# Patient Record
Sex: Female | Born: 1980 | Race: White | Hispanic: No | Marital: Married | Smoking: Never smoker
Health system: Southern US, Community
[De-identification: ages and names within clinical notes are randomized; demographics above are authoritative.]

## PROBLEM LIST (undated history)

## (undated) DIAGNOSIS — J45909 Unspecified asthma, uncomplicated: Secondary | ICD-10-CM

## (undated) DIAGNOSIS — E282 Polycystic ovarian syndrome: Secondary | ICD-10-CM

## (undated) DIAGNOSIS — N840 Polyp of corpus uteri: Secondary | ICD-10-CM

## (undated) DIAGNOSIS — F32A Depression, unspecified: Secondary | ICD-10-CM

## (undated) DIAGNOSIS — Z3183 Encounter for assisted reproductive fertility procedure cycle: Secondary | ICD-10-CM

## (undated) DIAGNOSIS — O24419 Gestational diabetes mellitus in pregnancy, unspecified control: Secondary | ICD-10-CM

## (undated) DIAGNOSIS — F419 Anxiety disorder, unspecified: Secondary | ICD-10-CM

## (undated) DIAGNOSIS — J189 Pneumonia, unspecified organism: Secondary | ICD-10-CM

## (undated) DIAGNOSIS — N970 Female infertility associated with anovulation: Secondary | ICD-10-CM

## (undated) DIAGNOSIS — F909 Attention-deficit hyperactivity disorder, unspecified type: Secondary | ICD-10-CM

## (undated) DIAGNOSIS — Z3141 Encounter for fertility testing: Secondary | ICD-10-CM

## (undated) DIAGNOSIS — F329 Major depressive disorder, single episode, unspecified: Secondary | ICD-10-CM

## (undated) HISTORY — DX: Depression, unspecified: F32.A

## (undated) HISTORY — DX: Polycystic ovarian syndrome: E28.2

## (undated) HISTORY — PX: BREAST REDUCTION SURGERY: SHX8

## (undated) HISTORY — PX: WISDOM TOOTH EXTRACTION: SHX21

## (undated) HISTORY — DX: Major depressive disorder, single episode, unspecified: F32.9

## (undated) HISTORY — DX: Encounter for fertility testing: Z31.41

## (undated) HISTORY — DX: Female infertility associated with anovulation: N97.0

## (undated) HISTORY — DX: Anxiety disorder, unspecified: F41.9

## (undated) HISTORY — DX: Encounter for assisted reproductive fertility procedure cycle: Z31.83

## (undated) HISTORY — PX: INCISIONAL HERNIA REPAIR: SHX193

## (undated) HISTORY — DX: Polyp of corpus uteri: N84.0

## (undated) HISTORY — PX: DILATION AND CURETTAGE OF UTERUS: SHX78

## (undated) HISTORY — PX: OVARIAN CYST REMOVAL: SHX89

## (undated) HISTORY — PX: HERNIA REPAIR: SHX51

---

## 2006-11-18 ENCOUNTER — Emergency Department (HOSPITAL_COMMUNITY): Admission: EM | Admit: 2006-11-18 | Discharge: 2006-11-19 | Payer: Self-pay | Admitting: Emergency Medicine

## 2010-10-16 LAB — CBC
Hemoglobin: 14.5
RBC: 4.73
WBC: 13.3 — ABNORMAL HIGH

## 2010-10-16 LAB — DIFFERENTIAL
Basophils Absolute: 0
Lymphocytes Relative: 4 — ABNORMAL LOW
Lymphs Abs: 0.5 — ABNORMAL LOW
Monocytes Absolute: 0.5
Monocytes Relative: 4
Neutro Abs: 12.2 — ABNORMAL HIGH

## 2010-10-16 LAB — BASIC METABOLIC PANEL
Calcium: 8.7
GFR calc Af Amer: >60
GFR calc non Af Amer: >60
Sodium: 136

## 2010-10-16 LAB — URINALYSIS, ROUTINE W REFLEX MICROSCOPIC
Glucose, UA: NEGATIVE
Ketones, ur: NEGATIVE
Protein, ur: NEGATIVE
pH: 5

## 2010-10-16 LAB — URINE MICROSCOPIC-ADD ON

## 2010-10-16 LAB — PREGNANCY, URINE: Preg Test, Ur: NEGATIVE

## 2013-02-18 ENCOUNTER — Other Ambulatory Visit: Payer: Self-pay | Admitting: Obstetrics and Gynecology

## 2013-02-18 DIAGNOSIS — IMO0002 Reserved for concepts with insufficient information to code with codable children: Secondary | ICD-10-CM

## 2013-02-26 ENCOUNTER — Ambulatory Visit (HOSPITAL_COMMUNITY)
Admission: RE | Admit: 2013-02-26 | Discharge: 2013-02-26 | Disposition: A | Discharge status: Home / Self Care | Payer: BC Managed Care – PPO | Source: Ambulatory Visit | Attending: Obstetrics and Gynecology | Admitting: Obstetrics and Gynecology

## 2013-02-26 DIAGNOSIS — IMO0002 Reserved for concepts with insufficient information to code with codable children: Secondary | ICD-10-CM

## 2013-02-26 DIAGNOSIS — Q51810 Arcuate uterus: Secondary | ICD-10-CM | POA: Insufficient documentation

## 2013-02-26 DIAGNOSIS — N979 Female infertility, unspecified: Secondary | ICD-10-CM | POA: Insufficient documentation

## 2013-02-26 MED ORDER — IOHEXOL 300 MG/ML  SOLN
20.0000 mL | Freq: Once | INTRAMUSCULAR | Status: AC | PRN
  Administered 2013-02-26: 9 mL

## 2013-03-17 ENCOUNTER — Other Ambulatory Visit: Payer: Self-pay | Admitting: Obstetrics and Gynecology

## 2013-03-17 DIAGNOSIS — R1900 Intra-abdominal and pelvic swelling, mass and lump, unspecified site: Secondary | ICD-10-CM

## 2013-03-21 ENCOUNTER — Other Ambulatory Visit: Payer: BC Managed Care – PPO

## 2013-03-21 ENCOUNTER — Ambulatory Visit
Admission: RE | Admit: 2013-03-21 | Discharge: 2013-03-21 | Disposition: A | Discharge status: Home / Self Care | Payer: BC Managed Care – PPO | Source: Ambulatory Visit | Attending: Obstetrics and Gynecology | Admitting: Obstetrics and Gynecology

## 2013-03-21 DIAGNOSIS — R1900 Intra-abdominal and pelvic swelling, mass and lump, unspecified site: Secondary | ICD-10-CM

## 2013-03-21 MED ORDER — GADOBENATE DIMEGLUMINE 529 MG/ML IV SOLN
20.0000 mL | Freq: Once | INTRAVENOUS | Status: AC | PRN
  Administered 2013-03-21: 20 mL via INTRAVENOUS

## 2013-11-01 DIAGNOSIS — Z9079 Acquired absence of other genital organ(s): Secondary | ICD-10-CM | POA: Insufficient documentation

## 2016-05-07 ENCOUNTER — Telehealth: Payer: Self-pay | Admitting: Cardiology

## 2016-05-07 NOTE — Telephone Encounter (Signed)
Received records from [facility] for appointment on 05/09/16 with Dr Antoine Poche.  Records put with Dr Hochrein's schedule for 05/09/16. lp

## 2016-05-08 NOTE — Progress Notes (Signed)
Cardiology Office Note   Date:  05/09/2016   ID:  Emily Sharp, DOB 1980/07/13, MRN [id number]  PCP:  Sid Falcon, MD  Cardiologist:   Rollene Rotunda, MD  Referring:  Dr. April Manson      Chief Complaint  Patient presents with  . Bradycardia      History of Present Illness: Emily Sharp is a 36 y.o. female who is referred by [place] for evaluation of a decreased heart rate that was noticed when she was under anesthesia for.  She's actually had 3 episodes. 2 episodes while she was under anesthesia for minor surgical procedures she says her heart drop into the 30s and she required apparently atrophy. Another episode occurred with conscious sedation event when she was uncomfortable with the procedure set up and had frank syncope with bradycardia. She otherwise does well. She denies any cardiovascular symptoms. She's not otherwise any presyncope or syncope. She's never had any cardiac workup. She walks for exercise about 4 times per week. 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 She takes stairs.  The patient denies any new symptoms such as chest discomfort, neck or arm discomfort. There has been no new shortness of breath, PND or orthopnea. There have been no reported palpitations, presyncope or syncope.     Past Medical History:  Diagnosis Date  . Anxiety and depression   . Encounter for assisted reproductive fertility procedure cycle   . Encounter for fertility testing   . Female infertility associated with anovulation   . PCOS (polycystic ovarian syndrome)   . Polyp of corpus uteri     Past Surgical History:  Procedure Laterality Date  . BREAST REDUCTION SURGERY    . INCISIONAL HERNIA REPAIR    . OVARIAN CYST REMOVAL       Current Outpatient Prescriptions  Medication Sig Dispense Refill  . methylPREDNISolone (MEDROL) 8 MG tablet Take 8 mg by mouth 2 (two) times daily.  0  . progesterone 50 MG/ML injection Inject 1 mL into the muscle  daily.  2  . promethazine (PHENERGAN) 25 MG tablet Take 25 mg by mouth every 8 (eight) hours as needed. for nausea  0  . sertraline (ZOLOFT) 50 MG tablet Take 50 mg by mouth every morning.  3  . SPRINTEC 28 0.25-35 MG-MCG tablet Take 1 tablet by mouth daily.  1   No current facility-administered medications for this visit.     Allergies:   Patient has no known allergies.    Social History:  The patient  reports that she has never smoked. She has never used smokeless tobacco.   Family History:  The patient's family history includes Bladder Cancer in her father; Diabetes in her father; Peripheral vascular disease in her father.    ROS:  Please see the history of present illness.   Otherwise, review of systems are positive for snoring.   All other systems are reviewed and negative.    PHYSICAL EXAM: VS:  BP 104/72   Pulse 84   Ht  (1.727 m)   Wt 255 lb 6.4 oz (115.8 kg)   BMI 38.83 kg/m  , BMI Body mass index is 38.83 kg/m. GENERAL:  Well appearing HEENT:  Pupils equal round and reactive, fundi not visualized, oral mucosa unremarkable NECK:  No jugular venous distention, waveform within normal limits, carotid upstroke brisk and symmetric, no bruits, no thyromegaly LYMPHATICS:  No cervical, inguinal adenopathy LUNGS:  Clear to auscultation bilaterally BACK:  No CVA tenderness CHEST:  Unremarkable HEART:  PMI not displaced or sustained,S1 and S2 within normal limits, no S3, no S4, no clicks, no rubs, no murmurs ABD:  Flat, positive bowel sounds normal in frequency in pitch, no bruits, no rebound, no guarding, no midline pulsatile mass, no hepatomegaly, no splenomegaly EXT:  2 plus pulses throughout, no edema, no cyanosis no clubbing SKIN:  No rashes no nodules NEURO:  Cranial nerves II through XII grossly intact, motor grossly intact throughout PSYCH:  Cognitively intact, oriented to person place and time    EKG:  EKG is ordered today. The ekg ordered today demonstrates  sinus rhythm, rate 84, axis within normal limits, intervals within normal limits, poor anterior R-wave progression, low voltage on the chest leads.   Recent Labs: No results found for requested labs within last 8760 hours.    Lipid Panel No results found for: CHOL, TRIG, HDL, CHOLHDL, VLDL, LDLCALC, LDLDIRECT    Wt Readings from Last 3 Encounters:  05/09/16 255 lb 6.4 oz (115.8 kg)      Other studies Reviewed: Additional studies/ records that were reviewed today include: Office records. Review of the above records demonstrates:  Please see elsewhere in the note.     ASSESSMENT AND PLAN:   BRADYCARDIA:  I will check a 24-hour Holter to evaluate. I suspect that this was a vagal episode. She might have sleep apnea as described below. It's unlikely that she'll need further treatment.  ABNORMAL EKG:  She has low voltage. I'll check an echocardiogram.  SNORING:  She has snoring and bradycardia. She'll need a sleep study.  Current medicines are reviewed at length with the patient today.  The patient does not have concerns regarding medicines.  The following changes have been made:  no change  Labs/ tests ordered today include:   Orders Placed This Encounter  Procedures  . Holter monitor - 24 hour  . ECHOCARDIOGRAM COMPLETE  . Split night study     Disposition:   FU with me as needed after these studies.     Signed, Rollene Rotunda, MD  05/09/2016 9:49 PM    [facility]

## 2016-05-09 ENCOUNTER — Ambulatory Visit (INDEPENDENT_AMBULATORY_CARE_PROVIDER_SITE_OTHER): Payer: BC Managed Care – PPO | Admitting: Cardiology

## 2016-05-09 ENCOUNTER — Encounter: Payer: Self-pay | Admitting: Cardiology

## 2016-05-09 VITALS — BP 104/72 | HR 84 | Ht 68.0 in | Wt 255.4 lb

## 2016-05-09 DIAGNOSIS — R9431 Abnormal electrocardiogram [ECG] [EKG]: Secondary | ICD-10-CM

## 2016-05-09 DIAGNOSIS — R001 Bradycardia, unspecified: Secondary | ICD-10-CM | POA: Diagnosis not present

## 2016-05-09 DIAGNOSIS — G4730 Sleep apnea, unspecified: Secondary | ICD-10-CM | POA: Insufficient documentation

## 2016-05-09 HISTORY — DX: Bradycardia, unspecified: R00.1

## 2016-05-09 NOTE — Patient Instructions (Signed)
Medication Instructions:  Continue current medications  Labwork: None Ordered  Testing/Procedures: Your physician has requested that you have an echocardiogram. Echocardiography is a painless test that uses sound waves to create images of your heart. It provides your doctor with information about the size and shape of your heart and how well your heart's chambers and valves are working. This procedure takes approximately one hour. There are no restrictions for this procedure.  Your physician has recommended that you wear a 24 hour holter monitor. Holter monitors are medical devices that record the heart's electrical activity. Doctors most often use these monitors to diagnose arrhythmias. Arrhythmias are problems with the speed or rhythm of the heartbeat. The monitor is a small, portable device. You can wear one while you do your normal daily activities. This is usually used to diagnose what is causing palpitations/syncope (passing out).  Your physician has recommended that you have a sleep study. This test records several body functions during sleep, including: brain activity, eye movement, oxygen and carbon dioxide blood levels, heart rate and rhythm, breathing rate and rhythm, the flow of air through your mouth and nose, snoring, body muscle movements, and chest and belly movement.  Follow-Up: Your physician recommends that you schedule a follow-up appointment in: After Test   Any Other Special Instructions Will Be Listed Below (If Applicable).     If you need a refill on your cardiac medications before your next appointment, please call your pharmacy.

## 2016-05-13 NOTE — Addendum Note (Signed)
Addended by: Neta EhlersRUITT, Mahsa Hanser M on: 05/13/2016 04:42 PM   Modules accepted: Orders

## 2016-05-23 ENCOUNTER — Other Ambulatory Visit (HOSPITAL_COMMUNITY): Payer: BC Managed Care – PPO

## 2016-06-10 ENCOUNTER — Encounter: Payer: Self-pay | Admitting: *Deleted

## 2016-06-10 NOTE — Progress Notes (Signed)
Patient ID: Emily Sharp, female   DOB: 07/16/1980, 36 y.o.   MRN: [id number]  Patient did not show up for 06/10/16, appointment, to have a 24 hour holter monitor applied.

## 2016-06-17 ENCOUNTER — Other Ambulatory Visit: Payer: Self-pay | Admitting: Family Medicine

## 2016-06-17 ENCOUNTER — Ambulatory Visit
Admission: RE | Admit: 2016-06-17 | Discharge: 2016-06-17 | Disposition: A | Discharge status: Home / Self Care | Payer: BC Managed Care – PPO | Source: Ambulatory Visit | Attending: Family Medicine | Admitting: Family Medicine

## 2016-06-17 DIAGNOSIS — R05 Cough: Secondary | ICD-10-CM

## 2016-06-17 DIAGNOSIS — R059 Cough, unspecified: Secondary | ICD-10-CM

## 2016-06-21 ENCOUNTER — Telehealth: Payer: Self-pay | Admitting: Cardiology

## 2016-06-21 NOTE — Telephone Encounter (Signed)
Called patient yesterday and she said she just found out she was pregnant and was unsure of having the echo.  I called the patient today and left a VM regarding the safety of having the echo while pregnant.  Left my number to call back to schedule.

## 2016-06-27 ENCOUNTER — Encounter (HOSPITAL_BASED_OUTPATIENT_CLINIC_OR_DEPARTMENT_OTHER): Payer: BC Managed Care – PPO

## 2016-06-27 ENCOUNTER — Encounter: Payer: Self-pay | Admitting: Cardiology

## 2016-07-04 ENCOUNTER — Telehealth (HOSPITAL_COMMUNITY): Payer: Self-pay | Admitting: Cardiology

## 2016-07-04 NOTE — Telephone Encounter (Signed)
Called pt and spoke with her and voiced that she just found out that she is pregnant and she voiced that her doctor told her that there is no urgency for this test. She is refusing at this time.

## 2016-07-14 NOTE — Progress Notes (Deleted)
Cardiology Office Note   Date:  07/14/2016   ID:  Emily Sharp, DOB 09/21/1980, MRN [id number]  PCP:  Loyal JacobsonKalish, Michael, MD  Cardiologist:   Rollene RotundaJames Temekia Caskey, MD  Referring:  Dr. April MansonYalcinkaya      No chief complaint on file.     History of Present Illness: Emily Sharp is a 36 y.o. female who is referred by Leonette MostKalish for evaluation of a decreased heart rate that was noticed when she was under anesthesia .  She's actually had 3 episodes. 2 episodes while she was under anesthesia for minor surgical procedures she says her heart drop into the 30s and she required apparently atropine. Another episode occurred with conscious sedation event when she was uncomfortable with the procedure sat  up and had frank syncope with bradycardia. At the last visit she was going to have an echo.  She was hesitant and found out that she is pregnant.  ***   She otherwise does well. She denies any cardiovascular symptoms. She's not otherwise any presyncope or syncope. She's never had any cardiac workup. She walks for exercise about 4 times per week. 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 She takes stairs.  The patient denies any new symptoms such as chest discomfort, neck or arm discomfort. There has been no new shortness of breath, PND or orthopnea. There have been no reported palpitations, presyncope or syncope.     Past Medical History:  Diagnosis Date  . Anxiety and depression   . Encounter for assisted reproductive fertility procedure cycle   . Encounter for fertility testing   . Female infertility associated with anovulation   . PCOS (polycystic ovarian syndrome)   . Polyp of corpus uteri     Past Surgical History:  Procedure Laterality Date  . BREAST REDUCTION SURGERY    . INCISIONAL HERNIA REPAIR    . OVARIAN CYST REMOVAL       Current Outpatient Prescriptions  Medication Sig Dispense Refill  . methylPREDNISolone (MEDROL) 8 MG tablet Take 8 mg by mouth 2 (two)  times daily.  0  . progesterone 50 MG/ML injection Inject 1 mL into the muscle daily.  2  . promethazine (PHENERGAN) 25 MG tablet Take 25 mg by mouth every 8 (eight) hours as needed. for nausea  0  . sertraline (ZOLOFT) 50 MG tablet Take 50 mg by mouth every morning.  3  . SPRINTEC 28 0.25-35 MG-MCG tablet Take 1 tablet by mouth daily.  1   No current facility-administered medications for this visit.     Allergies:   Patient has no known allergies.    ROS:  Please see the history of present illness.   Otherwise, review of systems are positive for ***.   All other systems are reviewed and negative.    PHYSICAL EXAM: VS:  There were no vitals taken for this visit. , BMI There is no height or weight on file to calculate BMI.  GENERAL:  Well appearing NECK:  No jugular venous distention, waveform within normal limits, carotid upstroke brisk and symmetric, no bruits, no thyromegaly LUNGS:  Clear to auscultation bilaterally BACK:  No CVA tenderness CHEST:  Unremarkable HEART:  PMI not displaced or sustained,S1 and S2 within normal limits, no S3, no S4, no clicks, no rubs, *** murmurs ABD:  Flat, positive bowel sounds normal in frequency in pitch, no bruits, no rebound, no guarding, no midline pulsatile mass, no hepatomegaly, no splenomegaly EXT:  2 plus pulses  throughout, no edema, no cyanosis no clubbing   GENERAL:  Well appearing HEENT:  Pupils equal round and reactive, fundi not visualized, oral mucosa unremarkable NECK:  No jugular venous distention, waveform within normal limits, carotid upstroke brisk and symmetric, no bruits, no thyromegaly LYMPHATICS:  No cervical, inguinal adenopathy LUNGS:  Clear to auscultation bilaterally BACK:  No CVA tenderness CHEST:  Unremarkable HEART:  PMI not displaced or sustained,S1 and S2 within normal limits, no S3, no S4, no clicks, no rubs, no murmurs ABD:  Flat, positive bowel sounds normal in frequency in pitch, no bruits, no rebound, no  guarding, no midline pulsatile mass, no hepatomegaly, no splenomegaly EXT:  2 plus pulses throughout, no edema, no cyanosis no clubbing SKIN:  No rashes no nodules NEURO:  Cranial nerves II through XII grossly intact, motor grossly intact throughout PSYCH:  Cognitively intact, oriented to person place and time    EKG:  EKG is *** ordered today. The ekg ordered today demonstrates sinus rhythm, rate ***, axis within normal limits, intervals within normal limits, poor anterior R-wave progression, low voltage on the chest leads.   Recent Labs: No results found for requested labs within last 8760 hours.    Lipid Panel No results found for: CHOL, TRIG, HDL, CHOLHDL, VLDL, LDLCALC, LDLDIRECT    Wt Readings from Last 3 Encounters:  05/09/16 255 lb 6.4 oz (115.8 kg)      Other studies Reviewed: Additional studies/ records that were reviewed today include: *** Review of the above records demonstrates:  ***   ASSESSMENT AND PLAN:   BRADYCARDIA:  ***  I will check a 24-hour Holter to evaluate. I suspect that this was a vagal episode. She might have sleep apnea as described below. It's unlikely that she'll need further treatment.  ABNORMAL EKG:  *** She has low voltage. I'll check an echocardiogram.  SNORING:  ***  She has snoring and bradycardia. She'll need a sleep study.  Current medicines are reviewed at length with the patient today.  The patient does not have concerns regarding medicines.  The following changes have been made:  ***  Labs/ tests ordered today include:   ***  No orders of the defined types were placed in this encounter.    Disposition:   Follow up with me ***   Signed, Rollene Rotunda, MD  07/14/2016 8:57 PM    [facility]

## 2016-07-15 ENCOUNTER — Encounter: Payer: Self-pay | Admitting: *Deleted

## 2016-07-15 ENCOUNTER — Ambulatory Visit: Payer: BC Managed Care – PPO | Admitting: Cardiology

## 2016-12-11 LAB — OB RESULTS CONSOLE ANTIBODY SCREEN: Antibody Screen: NEGATIVE

## 2016-12-11 LAB — OB RESULTS CONSOLE GC/CHLAMYDIA
CHLAMYDIA, DNA PROBE: NEGATIVE
Gonorrhea: NEGATIVE

## 2016-12-11 LAB — OB RESULTS CONSOLE HIV ANTIBODY (ROUTINE TESTING): HIV: NONREACTIVE

## 2016-12-11 LAB — OB RESULTS CONSOLE HEPATITIS B SURFACE ANTIGEN: Hepatitis B Surface Ag: NEGATIVE

## 2016-12-11 LAB — OB RESULTS CONSOLE RUBELLA ANTIBODY, IGM: Rubella: IMMUNE

## 2016-12-11 LAB — OB RESULTS CONSOLE RPR: RPR: NONREACTIVE

## 2016-12-11 LAB — OB RESULTS CONSOLE ABO/RH: RH TYPE: POSITIVE

## 2016-12-18 ENCOUNTER — Other Ambulatory Visit: Payer: Self-pay

## 2016-12-21 ENCOUNTER — Other Ambulatory Visit: Payer: Self-pay

## 2017-03-27 ENCOUNTER — Other Ambulatory Visit (HOSPITAL_COMMUNITY): Payer: Self-pay | Admitting: Obstetrics and Gynecology

## 2017-03-27 DIAGNOSIS — O09522 Supervision of elderly multigravida, second trimester: Secondary | ICD-10-CM

## 2017-03-27 DIAGNOSIS — Z3689 Encounter for other specified antenatal screening: Secondary | ICD-10-CM

## 2017-03-28 ENCOUNTER — Encounter (HOSPITAL_COMMUNITY): Payer: Self-pay | Admitting: Obstetrics and Gynecology

## 2017-04-02 ENCOUNTER — Encounter (HOSPITAL_COMMUNITY): Payer: Self-pay | Admitting: *Deleted

## 2017-04-03 ENCOUNTER — Other Ambulatory Visit: Payer: Self-pay

## 2017-04-03 ENCOUNTER — Other Ambulatory Visit (HOSPITAL_COMMUNITY): Payer: Self-pay | Admitting: Obstetrics and Gynecology

## 2017-04-03 ENCOUNTER — Ambulatory Visit (HOSPITAL_COMMUNITY)
Admission: RE | Admit: 2017-04-03 | Discharge: 2017-04-03 | Disposition: A | Discharge status: Home / Self Care | Payer: BC Managed Care – PPO | Source: Ambulatory Visit | Attending: Obstetrics and Gynecology | Admitting: Obstetrics and Gynecology

## 2017-04-03 ENCOUNTER — Other Ambulatory Visit (HOSPITAL_COMMUNITY): Payer: Self-pay | Admitting: *Deleted

## 2017-04-03 ENCOUNTER — Encounter (HOSPITAL_COMMUNITY): Payer: Self-pay

## 2017-04-03 DIAGNOSIS — O99212 Obesity complicating pregnancy, second trimester: Secondary | ICD-10-CM

## 2017-04-03 DIAGNOSIS — O09812 Supervision of pregnancy resulting from assisted reproductive technology, second trimester: Secondary | ICD-10-CM | POA: Insufficient documentation

## 2017-04-03 DIAGNOSIS — O99512 Diseases of the respiratory system complicating pregnancy, second trimester: Secondary | ICD-10-CM

## 2017-04-03 DIAGNOSIS — O09522 Supervision of elderly multigravida, second trimester: Secondary | ICD-10-CM

## 2017-04-03 DIAGNOSIS — E669 Obesity, unspecified: Secondary | ICD-10-CM | POA: Insufficient documentation

## 2017-04-03 DIAGNOSIS — K469 Unspecified abdominal hernia without obstruction or gangrene: Secondary | ICD-10-CM | POA: Insufficient documentation

## 2017-04-03 DIAGNOSIS — Z3A27 27 weeks gestation of pregnancy: Secondary | ICD-10-CM | POA: Diagnosis not present

## 2017-04-03 DIAGNOSIS — Z3689 Encounter for other specified antenatal screening: Secondary | ICD-10-CM | POA: Diagnosis present

## 2017-04-03 DIAGNOSIS — O26892 Other specified pregnancy related conditions, second trimester: Secondary | ICD-10-CM | POA: Insufficient documentation

## 2017-04-03 DIAGNOSIS — O30042 Twin pregnancy, dichorionic/diamniotic, second trimester: Secondary | ICD-10-CM | POA: Diagnosis not present

## 2017-04-03 DIAGNOSIS — Z362 Encounter for other antenatal screening follow-up: Secondary | ICD-10-CM

## 2017-04-03 DIAGNOSIS — J45909 Unspecified asthma, uncomplicated: Secondary | ICD-10-CM

## 2017-04-23 ENCOUNTER — Encounter: Payer: BC Managed Care – PPO | Attending: Obstetrics and Gynecology | Admitting: Registered"

## 2017-04-23 DIAGNOSIS — Z713 Dietary counseling and surveillance: Secondary | ICD-10-CM | POA: Diagnosis not present

## 2017-04-23 DIAGNOSIS — O99810 Abnormal glucose complicating pregnancy: Secondary | ICD-10-CM

## 2017-04-25 ENCOUNTER — Encounter: Payer: Self-pay | Admitting: Registered"

## 2017-04-25 DIAGNOSIS — O99810 Abnormal glucose complicating pregnancy: Secondary | ICD-10-CM

## 2017-04-25 HISTORY — DX: Abnormal glucose complicating pregnancy: O99.810

## 2017-04-25 NOTE — Progress Notes (Signed)
Patient was seen on 04/23/2017 for Gestational Diabetes self-management class at the [facility]. The following learning objectives were met by the patient during this course:   States the definition of Gestational Diabetes  States why dietary management is important in controlling blood glucose  Describes the effects each nutrient has on blood glucose levels  Demonstrates ability to create a balanced meal plan  Demonstrates carbohydrate counting   States when to check blood glucose levels  Demonstrates proper blood glucose monitoring techniques  States the effect of stress and exercise on blood glucose levels  States the importance of limiting caffeine and abstaining from alcohol and smoking  Blood glucose monitor given: Cont[employer] Next  Lot # [id number] Exp: 06/07/2018 Blood glucose reading: 130  Patient instructed to monitor glucose levels: FBS: 60 - <95; 1 hour: <140; 2 hour: <120  Patient received handouts:  Nutrition Diabetes and Pregnancy, including carb counting list  Patient will be seen for follow-up as needed.

## 2017-05-01 ENCOUNTER — Other Ambulatory Visit (HOSPITAL_COMMUNITY): Payer: Self-pay | Admitting: *Deleted

## 2017-05-01 ENCOUNTER — Ambulatory Visit (HOSPITAL_COMMUNITY)
Admission: RE | Admit: 2017-05-01 | Discharge: 2017-05-01 | Disposition: A | Discharge status: Home / Self Care | Payer: BC Managed Care – PPO | Source: Ambulatory Visit | Attending: Family Medicine | Admitting: Family Medicine

## 2017-05-01 ENCOUNTER — Encounter (HOSPITAL_COMMUNITY): Payer: Self-pay

## 2017-05-01 DIAGNOSIS — Z362 Encounter for other antenatal screening follow-up: Secondary | ICD-10-CM | POA: Diagnosis not present

## 2017-05-01 DIAGNOSIS — O30049 Twin pregnancy, dichorionic/diamniotic, unspecified trimester: Secondary | ICD-10-CM

## 2017-05-01 DIAGNOSIS — Z3A31 31 weeks gestation of pregnancy: Secondary | ICD-10-CM | POA: Insufficient documentation

## 2017-05-06 ENCOUNTER — Ambulatory Visit (INDEPENDENT_AMBULATORY_CARE_PROVIDER_SITE_OTHER): Payer: BC Managed Care – PPO | Admitting: *Deleted

## 2017-05-06 VITALS — BP 121/67 | HR 113

## 2017-05-06 DIAGNOSIS — O30049 Twin pregnancy, dichorionic/diamniotic, unspecified trimester: Secondary | ICD-10-CM

## 2017-05-06 HISTORY — DX: Twin pregnancy, dichorionic/diamniotic, unspecified trimester: O30.049

## 2017-05-06 NOTE — Progress Notes (Signed)
Pt has prenatal appt w/Dr. Elon Spanner today.

## 2017-05-13 ENCOUNTER — Ambulatory Visit: Payer: BC Managed Care – PPO | Admitting: *Deleted

## 2017-05-13 VITALS — BP 121/71 | HR 103

## 2017-05-13 DIAGNOSIS — O30049 Twin pregnancy, dichorionic/diamniotic, unspecified trimester: Secondary | ICD-10-CM

## 2017-05-15 ENCOUNTER — Inpatient Hospital Stay (HOSPITAL_COMMUNITY)
Admission: AD | Admit: 2017-05-15 | Discharge: 2017-05-15 | Disposition: A | Discharge status: Home / Self Care | Payer: BC Managed Care – PPO | Source: Ambulatory Visit | Attending: Obstetrics and Gynecology | Admitting: Obstetrics and Gynecology

## 2017-05-15 ENCOUNTER — Encounter (HOSPITAL_COMMUNITY): Payer: Self-pay | Admitting: *Deleted

## 2017-05-15 DIAGNOSIS — R102 Pelvic and perineal pain: Secondary | ICD-10-CM | POA: Insufficient documentation

## 2017-05-15 DIAGNOSIS — Z3A33 33 weeks gestation of pregnancy: Secondary | ICD-10-CM | POA: Insufficient documentation

## 2017-05-15 DIAGNOSIS — O9989 Other specified diseases and conditions complicating pregnancy, childbirth and the puerperium: Secondary | ICD-10-CM | POA: Diagnosis not present

## 2017-05-15 DIAGNOSIS — E282 Polycystic ovarian syndrome: Secondary | ICD-10-CM | POA: Diagnosis not present

## 2017-05-15 DIAGNOSIS — F329 Major depressive disorder, single episode, unspecified: Secondary | ICD-10-CM | POA: Insufficient documentation

## 2017-05-15 DIAGNOSIS — O99283 Endocrine, nutritional and metabolic diseases complicating pregnancy, third trimester: Secondary | ICD-10-CM | POA: Insufficient documentation

## 2017-05-15 DIAGNOSIS — O26893 Other specified pregnancy related conditions, third trimester: Secondary | ICD-10-CM

## 2017-05-15 DIAGNOSIS — O4703 False labor before 37 completed weeks of gestation, third trimester: Secondary | ICD-10-CM | POA: Diagnosis not present

## 2017-05-15 DIAGNOSIS — N949 Unspecified condition associated with female genital organs and menstrual cycle: Secondary | ICD-10-CM

## 2017-05-15 DIAGNOSIS — O30049 Twin pregnancy, dichorionic/diamniotic, unspecified trimester: Secondary | ICD-10-CM

## 2017-05-15 DIAGNOSIS — F419 Anxiety disorder, unspecified: Secondary | ICD-10-CM | POA: Insufficient documentation

## 2017-05-15 DIAGNOSIS — O30043 Twin pregnancy, dichorionic/diamniotic, third trimester: Secondary | ICD-10-CM | POA: Diagnosis not present

## 2017-05-15 DIAGNOSIS — O479 False labor, unspecified: Secondary | ICD-10-CM

## 2017-05-15 DIAGNOSIS — O99343 Other mental disorders complicating pregnancy, third trimester: Secondary | ICD-10-CM | POA: Insufficient documentation

## 2017-05-15 LAB — URINALYSIS, ROUTINE W REFLEX MICROSCOPIC
Bilirubin Urine: NEGATIVE
GLUCOSE, UA: 50 mg/dL — AB
Ketones, ur: NEGATIVE mg/dL
NITRITE: NEGATIVE
Protein, ur: NEGATIVE mg/dL
SPECIFIC GRAVITY, URINE: 1.019 (ref 1.005–1.030)
pH: 5 (ref 5.0–8.0)

## 2017-05-15 NOTE — MAU Note (Signed)
Pt reports cramping since this am, denies bleeding.

## 2017-05-15 NOTE — MAU Provider Note (Signed)
Chief Complaint:  Contractions   First Provider Initiated Contact with Patient 05/15/17 1731      HPI: Emily Sharp is a 37 y.o. G2P0010 at [redacted]w[redacted]d who presents to maternity admissions sent from the office for contractions. She presented to the office with report of constant pelvic pressure/pain describing it as menstrual like pain.  She reports her cervix was not dilated but she was sent to MAU for further evaluation since she is high risk with twins. There are no other associated symptoms.  She has not tried any treatments. She reports good fetal movement x2.   HPI  Past Medical History: Past Medical History:  Diagnosis Date  . Anxiety and depression   . Encounter for assisted reproductive fertility procedure cycle   . Encounter for fertility testing   . Female infertility associated with anovulation   . PCOS (polycystic ovarian syndrome)   . Polyp of corpus uteri     Past obstetric history: OB History  Gravida Para Term Preterm AB Living  2       1 0  SAB TAB Ectopic Multiple Live Births  1            # Outcome Date GA Lbr Len/2nd Weight Sex Delivery Anes PTL Lv  2 Current           1 SAB             Past Surgical History: Past Surgical History:  Procedure Laterality Date  . BREAST REDUCTION SURGERY    . INCISIONAL HERNIA REPAIR    . OVARIAN CYST REMOVAL    . WISDOM TOOTH EXTRACTION      Family History: Family History  Problem Relation Age of Onset  . Diabetes Father   . Peripheral vascular disease Father   . Bladder Cancer Father     Social History: Social History   Tobacco Use  . Smoking status: Never Smoker  . Smokeless tobacco: Never Used  Substance Use Topics  . Alcohol use: No  . Drug use: Never    Allergies: No Known Allergies  Meds:  No medications prior to admission.    ROS:  Review of Systems  Constitutional: Negative for chills, fatigue and fever.  HENT: Negative for sinus pressure.   Eyes: Negative for photophobia.   Respiratory: Negative for shortness of breath.   Cardiovascular: Negative for chest pain.  Gastrointestinal: Positive for abdominal pain. Negative for constipation, diarrhea, nausea and vomiting.  Genitourinary: Positive for pelvic pain. Negative for difficulty urinating, dysuria, flank pain, frequency, vaginal bleeding, vaginal discharge and vaginal pain.  Musculoskeletal: Negative for neck pain.  Neurological: Negative for dizziness, weakness and headaches.  Psychiatric/Behavioral: Negative.      I have reviewed patient's Past Medical Hx, Surgical Hx, Family Hx, Social Hx, medications and allergies.   Physical Exam   Patient Vitals for the past 24 hrs:  BP Temp Temp src Pulse Resp SpO2 Height Weight  05/15/17 1638 136/78 98.8 F (37.1 C) Oral (!) 101 18 98 %  (1.702 m) (!) 305 lb (138.3 kg)   Constitutional: Well-developed, well-nourished female in no acute distress.  Cardiovascular: normal rate Respiratory: normal effort GI: Abd soft, non-tender, gravid appropriate for gestational age.  MS: Extremities nontender, no edema, normal ROM Neurologic: Alert and oriented x 4.  GU: Neg CVAT.  PELVIC EXAM: Cervix pink, visually closed, without lesion, scant white creamy discharge, vaginal walls and external genitalia normal Bimanual exam: Cervix 0/long/high, firm, anterior, neg CMT, uterus nontender, nonenlarged, adnexa without  tenderness, enlargement, or mass     FHT Baby A: Baseline 130 , moderate variability, accelerations present, no decelerations FHT Baby B:  Baseline 135 , moderate variability, accelerations present, no decelerations Contractions: 2-10 minutes, mild to palpation   Labs: Results for orders placed or performed during the hospital encounter of 05/15/17 (from the past 24 hour(s))  Urinalysis, Routine w reflex microscopic     Status: Abnormal   Collection Time: 05/15/17  4:40 PM  Result Value Ref Range   Color, Urine YELLOW YELLOW   APPearance CLOUDY (A)  CLEAR   Specific Gravity, Urine 1.019 1.005 - 1.030   pH 5.0 5.0 - 8.0   Glucose, UA 50 (A) NEGATIVE mg/dL   Hgb urine dipstick SMALL (A) NEGATIVE   Bilirubin Urine NEGATIVE NEGATIVE   Ketones, ur NEGATIVE NEGATIVE mg/dL   Protein, ur NEGATIVE NEGATIVE mg/dL   Nitrite NEGATIVE NEGATIVE   Leukocytes, UA TRACE (A) NEGATIVE   RBC / HPF 0-5 0 - 5 RBC/hpf   WBC, UA 6-10 0 - 5 WBC/hpf   Bacteria, UA MANY (A) NONE SEEN   Squamous Epithelial / LPF 6-10 0 - 5   Mucus PRESENT       Imaging:  Bedside US performed to assist with placement of fetal monitors, Baby A and Baby B both in breech positions, fluid level subjectively normal x 2, FHR wnl x 2  MAU Course/MDM: Reviewed UA and no evidence of infection, however not a clean catch  NST reviewed and reactive x 2 Pt with improved symptoms, less cramping and pain after 2+ hours in MAU Consult [place] with presentation, exam findings and test results.  D/C home with preterm labor precautions Keep scheduled appt in office   Assessment: 1. Pelvic pressure in pregnancy, antepartum, third trimester   2. Braxton Hicks contractions   3. Dichorionic diamniotic twin pregnancy, antepartum     Plan: Discharge home Labor precautions and fetal kick counts Follow-up Information    Harold Hedge, MD Follow up.   Specialty:  Obstetrics and Gynecology Why:  As scheduled, return to MAU as needed for emergencies Contact information: [address] [place] [address] [phone]          Allergies as of 05/15/2017   No Known Allergies     Medication List    STOP taking these medications   SPRINTEC 28 0.25-35 MG-MCG tablet Generic drug:  norgestimate-ethinyl estradiol     TAKE these medications   ALBUTEROL IN Inhale into the lungs.   FLONASE NA Place into the nose.   FOLIC ACID PO Take by mouth.   methylPREDNISolone 8 MG tablet Commonly known as:  MEDROL Take 8 mg by mouth 2 (two) times daily.   PRENATAL  VITAMIN PO Take by mouth.   progesterone 50 MG/ML injection Inject 1 mL into the muscle daily.   promethazine 25 MG tablet Commonly known as:  PHENERGAN Take 25 mg by mouth every 8 (eight) hours as needed. for nausea   sertraline 50 MG tablet Commonly known as:  ZOLOFT Take 50 mg by mouth every morning.   ZYRTEC PO Take by mouth.       Sharen Counter Certified Nurse-Midwife 05/15/2017 7:34 PM

## 2017-05-20 ENCOUNTER — Ambulatory Visit (INDEPENDENT_AMBULATORY_CARE_PROVIDER_SITE_OTHER): Payer: BC Managed Care – PPO | Admitting: *Deleted

## 2017-05-20 VITALS — BP 122/77 | HR 106

## 2017-05-20 DIAGNOSIS — O30049 Twin pregnancy, dichorionic/diamniotic, unspecified trimester: Secondary | ICD-10-CM

## 2017-05-20 DIAGNOSIS — O30043 Twin pregnancy, dichorionic/diamniotic, third trimester: Secondary | ICD-10-CM | POA: Diagnosis not present

## 2017-05-20 NOTE — Progress Notes (Signed)
Pt has office visit w/Dr. Langston Masker today.

## 2017-05-27 ENCOUNTER — Ambulatory Visit (INDEPENDENT_AMBULATORY_CARE_PROVIDER_SITE_OTHER): Payer: BC Managed Care – PPO | Admitting: *Deleted

## 2017-05-27 VITALS — BP 131/75 | HR 105

## 2017-05-27 DIAGNOSIS — O30043 Twin pregnancy, dichorionic/diamniotic, third trimester: Secondary | ICD-10-CM

## 2017-05-27 DIAGNOSIS — O30049 Twin pregnancy, dichorionic/diamniotic, unspecified trimester: Secondary | ICD-10-CM

## 2017-05-27 NOTE — Progress Notes (Signed)
Pt has office visit with Dr. Henderson Cloud today.

## 2017-05-30 ENCOUNTER — Encounter (HOSPITAL_COMMUNITY): Payer: Self-pay

## 2017-05-30 ENCOUNTER — Ambulatory Visit (HOSPITAL_COMMUNITY): Payer: BC Managed Care – PPO

## 2017-06-05 ENCOUNTER — Ambulatory Visit: Payer: BC Managed Care – PPO | Admitting: *Deleted

## 2017-06-05 ENCOUNTER — Encounter (HOSPITAL_COMMUNITY): Payer: Self-pay

## 2017-06-05 ENCOUNTER — Telehealth (HOSPITAL_COMMUNITY): Payer: Self-pay | Admitting: *Deleted

## 2017-06-05 VITALS — BP 125/80 | HR 98

## 2017-06-05 DIAGNOSIS — O30049 Twin pregnancy, dichorionic/diamniotic, unspecified trimester: Secondary | ICD-10-CM

## 2017-06-05 NOTE — Progress Notes (Signed)
Office visit w/Dr. Vincente Poli today

## 2017-06-05 NOTE — Telephone Encounter (Signed)
Preadmission screen  

## 2017-06-06 ENCOUNTER — Encounter (HOSPITAL_COMMUNITY): Payer: Self-pay

## 2017-06-06 NOTE — Patient Instructions (Signed)
Emily Sharp  06/06/2017   Your procedure is scheduled on:  06/10/2017  [facility] AM[facility]  Pick up the phone at the desk and dial [phone]  Call this number if you have problems the morning of surgery:[phone]  Remember:   Do not eat food:(After Midnight) Desps de medianoche.  Do not drink clear liquids: (After Midnight) Desps de medianoche.  Take these medicines the morning of surgery with A SIP OF WATER: may take zyrtec and flonase   Do not wear jewelry, make-up or nail polish.  Do not wear lotions, powders, or perfumes. Do not wear deodorant.  Do not shave 48 hours prior to surgery.  Do not bring valuables to the hospital.  [facility] is not   responsible for any belongings or valuables brought to the hospital.  Contacts, dentures or bridgework may not be worn into surgery.  Leave suitcase in the car. After surgery it may be brought to your room.  For patients admitted to the hospital, checkout time is 11:00 AM the day of              discharge.    N/A   Please read over the following fact sheets that you were given:   Surgical Site Infection Prevention

## 2017-06-09 ENCOUNTER — Telehealth (HOSPITAL_COMMUNITY): Payer: Self-pay | Admitting: *Deleted

## 2017-06-09 ENCOUNTER — Encounter (HOSPITAL_COMMUNITY)
Admission: RE | Admit: 2017-06-09 | Discharge: 2017-06-09 | Disposition: A | Discharge status: Home / Self Care | Payer: BC Managed Care – PPO | Source: Ambulatory Visit | Attending: Obstetrics and Gynecology | Admitting: Obstetrics and Gynecology

## 2017-06-09 LAB — ABO/RH: ABO/RH(D): O POS

## 2017-06-09 LAB — CBC
HEMATOCRIT: 32.2 % — AB (ref 36.0–46.0)
HEMOGLOBIN: 9.8 g/dL — AB (ref 12.0–15.0)
MCH: 26.3 pg (ref 26.0–34.0)
MCHC: 30.4 g/dL (ref 30.0–36.0)
MCV: 86.6 fL (ref 78.0–100.0)
Platelets: 158 10*3/uL (ref 150–400)
RBC: 3.72 MIL/uL — ABNORMAL LOW (ref 3.87–5.11)
RDW: 15.5 % (ref 11.5–15.5)
WBC: 9.4 10*3/uL (ref 4.0–10.5)

## 2017-06-09 LAB — TYPE AND SCREEN
ABO/RH(D): O POS
ANTIBODY SCREEN: NEGATIVE

## 2017-06-09 NOTE — Anesthesia Preprocedure Evaluation (Addendum)
Anesthesia Evaluation  Patient identified by MRN, date of birth, ID band Patient awake    Reviewed: Allergy & Precautions, NPO status , Patient's Chart, lab work & pertinent test results  Airway Mallampati: III  TM Distance: >3 FB Neck ROM: Full    Dental no notable dental hx. (+) Teeth Intact, Dental Advisory Given   Pulmonary asthma , sleep apnea ,    Pulmonary exam normal breath sounds clear to auscultation       Cardiovascular negative cardio ROS Normal cardiovascular exam Rhythm:Regular Rate:Normal     Neuro/Psych Anxiety negative neurological ROS     GI/Hepatic negative GI ROS, Neg liver ROS,   Endo/Other  diabetesMorbid obesity  Renal/GU negative Renal ROS     Musculoskeletal   Abdominal (+) + obese,   Peds  Hematology negative hematology ROS (+)   Anesthesia Other Findings   Reproductive/Obstetrics (+) Pregnancy                             Lab Results  Component Value Date   WBC 9.4 06/09/2017   HGB 9.8 (L) 06/09/2017   HCT 32.2 (L) 06/09/2017   MCV 86.6 06/09/2017   PLT 158 06/09/2017    Anesthesia Physical Anesthesia Plan  ASA: III  Anesthesia Plan: Spinal   Post-op Pain Management:    Induction:   PONV Risk Score and Plan: Treatment may vary due to age or medical condition  Airway Management Planned: Mask, Natural Airway and Nasal Cannula  Additional Equipment:   Intra-op Plan:   Post-operative Plan:   Informed Consent: I have reviewed the patients History and Physical, chart, labs and discussed the procedure including the risks, benefits and alternatives for the proposed anesthesia with the patient or authorized representative who has indicated his/her understanding and acceptance.     Plan Discussed with: CRNA  Anesthesia Plan Comments:        Anesthesia Quick Evaluation

## 2017-06-09 NOTE — H&P (Signed)
Emily DionesBridget Sharp is a 37 y.o. female presenting for primary cesarean section for Emily Sharp twins. Pregnancy complicated by gestational diabetes - diet controlled. U/S @ 34 3/7=A>breech, 3231 gm (7# 2oz) 98%, B>vertex, 3163gm (7# 0oz) 98%. She has a history of asthma. Also had abdominal hernia repair with mesh. Breech/breech at bedside U/S @ 36 5/7 weeks. BP148/84, 140/82 in office. Labs normal on 06/05/17. OB History    Gravida  2   Para      Term      Preterm      AB  1   Living  0     SAB  1   TAB      Ectopic      Multiple      Live Births             Past Medical History:  Diagnosis Date  . Anxiety and depression   . Asthma   . Encounter for assisted reproductive fertility procedure cycle   . Encounter for fertility testing   . Female infertility associated with anovulation   . Gestational diabetes   . PCOS (polycystic ovarian syndrome)   . Polyp of corpus uteri    Past Surgical History:  Procedure Laterality Date  . BREAST REDUCTION SURGERY    . DILATION AND CURETTAGE OF UTERUS    . INCISIONAL HERNIA REPAIR    . OVARIAN CYST REMOVAL    . WISDOM TOOTH EXTRACTION     Family History: family history includes AAA (abdominal aortic aneurysm) in her father; Bladder Cancer in her father; Diabetes in her father; Hypertension in her father; Peripheral vascular disease in her father; Skin cancer in her father, maternal grandfather, and mother. Social History:  reports that she has never smoked. She has never used smokeless tobacco. She reports that she does not drink alcohol or use drugs.     Maternal Diabetes: Yes:  Diabetes Type:  Diet controlled Genetic Screening: Normal Maternal Ultrasounds/Referrals: Normal Fetal Ultrasounds or other Referrals:  None Maternal Substance Abuse:  No Significant Maternal Medications:  None Significant Maternal Lab Results:  None Other Comments:  None  Review of Systems  Constitutional: Negative for fever.  Eyes:  Negative for blurred vision.  Gastrointestinal: Negative for abdominal pain.  Neurological: Negative for headaches.   History   There were no vitals taken for this visit. Exam Physical Exam  Cardiovascular: Normal rate and regular rhythm.  Respiratory: Effort normal and breath sounds normal.  GI: Soft.    Prenatal labs: ABO, Rh: --/--/O POS, O POS Performed at [facility], [address] [place], [place] [address]  [phone](06/03 1057) Antibody: NEG (06/03 1057) Rubella: Immune (12/05 0000) RPR: Nonreactive (12/05 0000)  HBsAg: Negative (12/05 0000)  HIV: Non-reactive (12/05 0000)  GBS:     Assessment/Plan: 37 yo G2P0 with breech/breech twins (IVF)  PIH For cesarean section   Emily Sharp 06/09/2017, 5:39 PM

## 2017-06-09 NOTE — Telephone Encounter (Signed)
Called Dr Henderson Cloudomblin due to pt reporting a boil on her upper thigh close to her groin that is looking "yucky" ,  Her Cs is tomorrow.  Dr Henderson Cloudtomblin requested pt to come to office for evaluation and possible antibiotics.  Instructed pt to call office after lab draw for work in appointment.  Verbalized understanding.

## 2017-06-10 ENCOUNTER — Inpatient Hospital Stay (HOSPITAL_COMMUNITY): Payer: BC Managed Care – PPO | Admitting: Anesthesiology

## 2017-06-10 ENCOUNTER — Encounter (HOSPITAL_COMMUNITY): Payer: Self-pay | Admitting: Anesthesiology

## 2017-06-10 ENCOUNTER — Encounter (HOSPITAL_COMMUNITY): Payer: Self-pay | Admitting: *Deleted

## 2017-06-10 ENCOUNTER — Inpatient Hospital Stay (HOSPITAL_COMMUNITY)
Admission: AD | Admit: 2017-06-10 | Discharge: 2017-06-13 | DRG: 787 | Disposition: A | Discharge status: Home / Self Care | Payer: BC Managed Care – PPO | Attending: Obstetrics and Gynecology | Admitting: Obstetrics and Gynecology

## 2017-06-10 ENCOUNTER — Encounter (HOSPITAL_COMMUNITY)
Admission: AD | Disposition: A | Discharge status: Home / Self Care | Payer: Self-pay | Source: Home / Self Care | Attending: Obstetrics and Gynecology

## 2017-06-10 DIAGNOSIS — O99214 Obesity complicating childbirth: Secondary | ICD-10-CM | POA: Diagnosis present

## 2017-06-10 DIAGNOSIS — O24419 Gestational diabetes mellitus in pregnancy, unspecified control: Secondary | ICD-10-CM

## 2017-06-10 DIAGNOSIS — Z3A37 37 weeks gestation of pregnancy: Secondary | ICD-10-CM | POA: Diagnosis not present

## 2017-06-10 DIAGNOSIS — J45909 Unspecified asthma, uncomplicated: Secondary | ICD-10-CM | POA: Diagnosis present

## 2017-06-10 DIAGNOSIS — O3090 Multiple gestation, unspecified, unspecified trimester: Secondary | ICD-10-CM

## 2017-06-10 DIAGNOSIS — O9952 Diseases of the respiratory system complicating childbirth: Secondary | ICD-10-CM | POA: Diagnosis present

## 2017-06-10 DIAGNOSIS — O328XX1 Maternal care for other malpresentation of fetus, fetus 1: Secondary | ICD-10-CM | POA: Diagnosis present

## 2017-06-10 DIAGNOSIS — O139 Gestational [pregnancy-induced] hypertension without significant proteinuria, unspecified trimester: Secondary | ICD-10-CM

## 2017-06-10 DIAGNOSIS — L02415 Cutaneous abscess of right lower limb: Secondary | ICD-10-CM | POA: Diagnosis present

## 2017-06-10 DIAGNOSIS — O134 Gestational [pregnancy-induced] hypertension without significant proteinuria, complicating childbirth: Secondary | ICD-10-CM | POA: Diagnosis present

## 2017-06-10 DIAGNOSIS — O9972 Diseases of the skin and subcutaneous tissue complicating childbirth: Secondary | ICD-10-CM | POA: Diagnosis present

## 2017-06-10 DIAGNOSIS — O24420 Gestational diabetes mellitus in childbirth, diet controlled: Secondary | ICD-10-CM | POA: Diagnosis present

## 2017-06-10 DIAGNOSIS — O9902 Anemia complicating childbirth: Secondary | ICD-10-CM | POA: Diagnosis present

## 2017-06-10 DIAGNOSIS — O321XX1 Maternal care for breech presentation, fetus 1: Secondary | ICD-10-CM | POA: Diagnosis present

## 2017-06-10 DIAGNOSIS — D649 Anemia, unspecified: Secondary | ICD-10-CM | POA: Diagnosis present

## 2017-06-10 DIAGNOSIS — Z98891 History of uterine scar from previous surgery: Secondary | ICD-10-CM

## 2017-06-10 DIAGNOSIS — O30043 Twin pregnancy, dichorionic/diamniotic, third trimester: Secondary | ICD-10-CM | POA: Diagnosis present

## 2017-06-10 HISTORY — DX: Gestational diabetes mellitus in pregnancy, unspecified control: O24.419

## 2017-06-10 HISTORY — DX: Gestational (pregnancy-induced) hypertension without significant proteinuria, unspecified trimester: O13.9

## 2017-06-10 HISTORY — DX: Multiple gestation, unspecified, unspecified trimester: O30.90

## 2017-06-10 HISTORY — DX: History of uterine scar from previous surgery: Z98.891

## 2017-06-10 LAB — RPR: RPR Ser Ql: NONREACTIVE

## 2017-06-10 LAB — GLUCOSE, CAPILLARY
GLUCOSE-CAPILLARY: 88 mg/dL (ref 65–99)
GLUCOSE-CAPILLARY: 101 mg/dL — AB (ref 65–99)

## 2017-06-10 SURGERY — Surgical Case
Anesthesia: Spinal

## 2017-06-10 MED ORDER — LACTATED RINGERS IV SOLN
INTRAVENOUS | Status: DC
  Administered 2017-06-10: 125 mL/h via INTRAVENOUS

## 2017-06-10 MED ORDER — OXYTOCIN 10 UNIT/ML IJ SOLN
INTRAMUSCULAR | Status: AC
  Filled 2017-06-10: qty 4

## 2017-06-10 MED ORDER — SODIUM CHLORIDE 0.9 % IV SOLN: INTRAVENOUS | Status: DC

## 2017-06-10 MED ORDER — NALBUPHINE HCL 10 MG/ML IJ SOLN: 5.0000 mg | Freq: Once | INTRAMUSCULAR | Status: DC | PRN

## 2017-06-10 MED ORDER — PRENATAL MULTIVITAMIN CH
1.0000 | ORAL_TABLET | Freq: Every day | ORAL | Status: DC
  Administered 2017-06-11 – 2017-06-13 (×3): 1 via ORAL
  Filled 2017-06-10 (×3): qty 1

## 2017-06-10 MED ORDER — KETOROLAC TROMETHAMINE 30 MG/ML IJ SOLN
INTRAMUSCULAR | Status: AC
  Filled 2017-06-10: qty 1

## 2017-06-10 MED ORDER — FLUTICASONE PROPIONATE 50 MCG/ACT NA SUSP: 1.0000 | Freq: Every day | NASAL | Status: DC | PRN

## 2017-06-10 MED ORDER — DIPHENHYDRAMINE HCL 25 MG PO CAPS: 25.0000 mg | ORAL_CAPSULE | ORAL | Status: DC | PRN

## 2017-06-10 MED ORDER — NALBUPHINE HCL 10 MG/ML IJ SOLN: 5.0000 mg | INTRAMUSCULAR | Status: DC | PRN

## 2017-06-10 MED ORDER — DIPHENHYDRAMINE HCL 50 MG/ML IJ SOLN
INTRAMUSCULAR | Status: DC | PRN
  Administered 2017-06-10: 12.5 mg via INTRAVENOUS

## 2017-06-10 MED ORDER — WITCH HAZEL-GLYCERIN EX PADS: 1.0000 "application " | MEDICATED_PAD | CUTANEOUS | Status: DC | PRN

## 2017-06-10 MED ORDER — SOD CITRATE-CITRIC ACID 500-334 MG/5ML PO SOLN
30.0000 mL | ORAL | Status: AC
  Administered 2017-06-10: 30 mL via ORAL
  Filled 2017-06-10: qty 15

## 2017-06-10 MED ORDER — DEXAMETHASONE SODIUM PHOSPHATE 4 MG/ML IJ SOLN
INTRAMUSCULAR | Status: AC
  Filled 2017-06-10: qty 1

## 2017-06-10 MED ORDER — DIPHENHYDRAMINE HCL 50 MG/ML IJ SOLN
INTRAMUSCULAR | Status: AC
  Filled 2017-06-10: qty 1

## 2017-06-10 MED ORDER — SIMETHICONE 80 MG PO CHEW
80.0000 mg | CHEWABLE_TABLET | ORAL | Status: DC
  Administered 2017-06-11 – 2017-06-12 (×3): 80 mg via ORAL
  Filled 2017-06-10 (×3): qty 1

## 2017-06-10 MED ORDER — SENNOSIDES-DOCUSATE SODIUM 8.6-50 MG PO TABS
2.0000 | ORAL_TABLET | ORAL | Status: DC
  Administered 2017-06-11 – 2017-06-12 (×3): 2 via ORAL
  Filled 2017-06-10 (×3): qty 2

## 2017-06-10 MED ORDER — HYDROCODONE-ACETAMINOPHEN 7.5-325 MG PO TABS
1.0000 | ORAL_TABLET | Freq: Once | ORAL | Status: DC | PRN
Start: 2017-06-10 — End: 2017-06-10

## 2017-06-10 MED ORDER — PHENYLEPHRINE 8 MG IN D5W 100 ML (0.08MG/ML) PREMIX OPTIME
INJECTION | INTRAVENOUS | Status: DC | PRN
  Administered 2017-06-10: 60 ug/min via INTRAVENOUS

## 2017-06-10 MED ORDER — TETANUS-DIPHTH-ACELL PERTUSSIS 5-2.5-18.5 LF-MCG/0.5 IM SUSP: 0.5000 mL | Freq: Once | INTRAMUSCULAR | Status: DC

## 2017-06-10 MED ORDER — PROMETHAZINE HCL 25 MG/ML IJ SOLN: 6.2500 mg | INTRAMUSCULAR | Status: DC | PRN

## 2017-06-10 MED ORDER — IBUPROFEN 600 MG PO TABS
600.0000 mg | ORAL_TABLET | Freq: Four times a day (QID) | ORAL | Status: DC
  Administered 2017-06-10 – 2017-06-13 (×10): 600 mg via ORAL
  Filled 2017-06-10 (×12): qty 1

## 2017-06-10 MED ORDER — OXYTOCIN 40 UNITS IN LACTATED RINGERS INFUSION - SIMPLE MED: 2.5000 [IU]/h | INTRAVENOUS | Status: AC

## 2017-06-10 MED ORDER — MORPHINE SULFATE (PF) 0.5 MG/ML IJ SOLN
INTRAMUSCULAR | Status: AC
  Filled 2017-06-10: qty 10

## 2017-06-10 MED ORDER — KETOROLAC TROMETHAMINE 30 MG/ML IJ SOLN
30.0000 mg | Freq: Four times a day (QID) | INTRAMUSCULAR | Status: AC | PRN
  Administered 2017-06-10: 30 mg via INTRAMUSCULAR

## 2017-06-10 MED ORDER — BUPIVACAINE IN DEXTROSE 0.75-8.25 % IT SOLN
INTRATHECAL | Status: DC | PRN
  Administered 2017-06-10: 1 mL via INTRATHECAL

## 2017-06-10 MED ORDER — MEPERIDINE HCL 25 MG/ML IJ SOLN: 6.2500 mg | INTRAMUSCULAR | Status: DC | PRN

## 2017-06-10 MED ORDER — DIPHENHYDRAMINE HCL 25 MG PO CAPS: 25.0000 mg | ORAL_CAPSULE | Freq: Four times a day (QID) | ORAL | Status: DC | PRN

## 2017-06-10 MED ORDER — SIMETHICONE 80 MG PO CHEW: 80.0000 mg | CHEWABLE_TABLET | ORAL | Status: DC | PRN

## 2017-06-10 MED ORDER — LACTATED RINGERS IV SOLN
INTRAVENOUS | Status: DC | PRN
  Administered 2017-06-10 (×2): via INTRAVENOUS

## 2017-06-10 MED ORDER — ALBUTEROL SULFATE (2.5 MG/3ML) 0.083% IN NEBU: 3.0000 mL | INHALATION_SOLUTION | RESPIRATORY_TRACT | Status: DC | PRN

## 2017-06-10 MED ORDER — ZOLPIDEM TARTRATE 5 MG PO TABS: 5.0000 mg | ORAL_TABLET | Freq: Every evening | ORAL | Status: DC | PRN

## 2017-06-10 MED ORDER — OXYCODONE HCL 5 MG PO TABS
10.0000 mg | ORAL_TABLET | ORAL | Status: DC | PRN
  Administered 2017-06-11 – 2017-06-13 (×4): 10 mg via ORAL
  Filled 2017-06-10 (×4): qty 2

## 2017-06-10 MED ORDER — EPHEDRINE 5 MG/ML INJ
INTRAVENOUS | Status: AC
  Filled 2017-06-10: qty 10

## 2017-06-10 MED ORDER — ACETAMINOPHEN 325 MG PO TABS
650.0000 mg | ORAL_TABLET | ORAL | Status: DC | PRN
  Administered 2017-06-11 – 2017-06-13 (×6): 650 mg via ORAL
  Filled 2017-06-10 (×6): qty 2

## 2017-06-10 MED ORDER — HYDROMORPHONE HCL 1 MG/ML IJ SOLN: 0.2500 mg | INTRAMUSCULAR | Status: DC | PRN

## 2017-06-10 MED ORDER — LACTATED RINGERS IV SOLN
INTRAVENOUS | Status: DC | PRN
  Administered 2017-06-10: 40 [IU] via INTRAVENOUS

## 2017-06-10 MED ORDER — ONDANSETRON HCL 4 MG/2ML IJ SOLN
INTRAMUSCULAR | Status: DC | PRN
  Administered 2017-06-10: 4 mg via INTRAVENOUS

## 2017-06-10 MED ORDER — LACTATED RINGERS IV SOLN
INTRAVENOUS | Status: DC | PRN
  Administered 2017-06-10: 11:00:00 via INTRAVENOUS

## 2017-06-10 MED ORDER — PHENYLEPHRINE 8 MG IN D5W 100 ML (0.08MG/ML) PREMIX OPTIME
INJECTION | INTRAVENOUS | Status: AC
  Filled 2017-06-10: qty 100

## 2017-06-10 MED ORDER — DIPHENHYDRAMINE HCL 50 MG/ML IJ SOLN: 12.5000 mg | INTRAMUSCULAR | Status: DC | PRN

## 2017-06-10 MED ORDER — KETOROLAC TROMETHAMINE 30 MG/ML IJ SOLN: 30.0000 mg | Freq: Four times a day (QID) | INTRAMUSCULAR | Status: AC | PRN

## 2017-06-10 MED ORDER — DEXTROSE 5 % IV SOLN
3.0000 g | INTRAVENOUS | Status: AC
  Administered 2017-06-10: 3 g via INTRAVENOUS
  Filled 2017-06-10: qty 3000

## 2017-06-10 MED ORDER — ACETAMINOPHEN 10 MG/ML IV SOLN: 1000.0000 mg | Freq: Once | INTRAVENOUS | Status: DC | PRN

## 2017-06-10 MED ORDER — SIMETHICONE 80 MG PO CHEW
80.0000 mg | CHEWABLE_TABLET | Freq: Three times a day (TID) | ORAL | Status: DC
  Administered 2017-06-10 – 2017-06-13 (×7): 80 mg via ORAL
  Filled 2017-06-10 (×7): qty 1

## 2017-06-10 MED ORDER — COCONUT OIL OIL: 1.0000 "application " | TOPICAL_OIL | Status: DC | PRN

## 2017-06-10 MED ORDER — DEXAMETHASONE SODIUM PHOSPHATE 4 MG/ML IJ SOLN
INTRAMUSCULAR | Status: DC | PRN
  Administered 2017-06-10: 4 mg via INTRAVENOUS

## 2017-06-10 MED ORDER — PHENYLEPHRINE 40 MCG/ML (10ML) SYRINGE FOR IV PUSH (FOR BLOOD PRESSURE SUPPORT)
PREFILLED_SYRINGE | INTRAVENOUS | Status: AC
  Filled 2017-06-10: qty 10

## 2017-06-10 MED ORDER — NALOXONE HCL 4 MG/10ML IJ SOLN
1.0000 ug/kg/h | INTRAVENOUS | Status: DC | PRN
  Filled 2017-06-10: qty 5

## 2017-06-10 MED ORDER — OXYCODONE HCL 5 MG PO TABS
5.0000 mg | ORAL_TABLET | ORAL | Status: DC | PRN
  Administered 2017-06-11 – 2017-06-12 (×5): 5 mg via ORAL
  Filled 2017-06-10 (×5): qty 1

## 2017-06-10 MED ORDER — MENTHOL 3 MG MT LOZG: 1.0000 | LOZENGE | OROMUCOSAL | Status: DC | PRN

## 2017-06-10 MED ORDER — EPHEDRINE SULFATE 50 MG/ML IJ SOLN
INTRAMUSCULAR | Status: DC | PRN
  Administered 2017-06-10: 10 mg via INTRAVENOUS

## 2017-06-10 MED ORDER — DIBUCAINE 1 % RE OINT: 1.0000 "application " | TOPICAL_OINTMENT | RECTAL | Status: DC | PRN

## 2017-06-10 MED ORDER — ONDANSETRON HCL 4 MG/2ML IJ SOLN
INTRAMUSCULAR | Status: AC
  Filled 2017-06-10: qty 2

## 2017-06-10 MED ORDER — MORPHINE SULFATE (PF) 0.5 MG/ML IJ SOLN
INTRAMUSCULAR | Status: DC | PRN
  Administered 2017-06-10: .2 mg via INTRATHECAL

## 2017-06-10 MED ORDER — SCOPOLAMINE 1 MG/3DAYS TD PT72
1.0000 | MEDICATED_PATCH | Freq: Once | TRANSDERMAL | Status: AC
  Administered 2017-06-10: 1.5 mg via TRANSDERMAL
  Filled 2017-06-10: qty 1

## 2017-06-10 SURGICAL SUPPLY — 40 items
BENZOIN TINCTURE PRP APPL 2/3 (GAUZE/BANDAGES/DRESSINGS) IMPLANT
CHLORAPREP W/TINT 26ML (MISCELLANEOUS) ×3 IMPLANT
CLAMP CORD UMBIL (MISCELLANEOUS) ×3 IMPLANT
CLOSURE WOUND 1/2 X4 (GAUZE/BANDAGES/DRESSINGS)
CLOTH BEACON ORANGE TIMEOUT ST (SAFETY) ×3 IMPLANT
DERMABOND ADVANCED (GAUZE/BANDAGES/DRESSINGS)
DERMABOND ADVANCED .7 DNX12 (GAUZE/BANDAGES/DRESSINGS) IMPLANT
DRESSING DISP NPWT PICO 4X12 (MISCELLANEOUS) ×3 IMPLANT
DRSG OPSITE POSTOP 4X10 (GAUZE/BANDAGES/DRESSINGS) ×3 IMPLANT
ELECT REM PT RETURN 9FT ADLT (ELECTROSURGICAL) ×3
ELECTRODE REM PT RTRN 9FT ADLT (ELECTROSURGICAL) ×1 IMPLANT
EXTRACTOR VACUUM M CUP 4 TUBE (SUCTIONS) IMPLANT
EXTRACTOR VACUUM M CUP 4' TUBE (SUCTIONS)
GLOVE BIO SURGEON STRL SZ7.5 (GLOVE) ×3 IMPLANT
GLOVE BIOGEL PI IND STRL 7.0 (GLOVE) ×2 IMPLANT
GLOVE BIOGEL PI INDICATOR 7.0 (GLOVE) ×4
GOWN STRL REUS W/TWL LRG LVL3 (GOWN DISPOSABLE) ×6 IMPLANT
KIT ABG SYR 3ML LUER SLIP (SYRINGE) ×6 IMPLANT
NDL SAFETY ECLIPSE 18X1.5 (NEEDLE) ×1 IMPLANT
NEEDLE HYPO 18GX1.5 SHARP (NEEDLE) ×2
NEEDLE HYPO 21X1.5 SAFETY (NEEDLE) ×3 IMPLANT
NEEDLE HYPO 25X5/8 SAFETYGLIDE (NEEDLE) ×6 IMPLANT
NS IRRIG 1000ML POUR BTL (IV SOLUTION) ×3 IMPLANT
PACK C SECTION WH (CUSTOM PROCEDURE TRAY) ×3 IMPLANT
PAD OB MATERNITY 4.3X12.25 (PERSONAL CARE ITEMS) ×3 IMPLANT
PENCIL SMOKE EVAC W/HOLSTER (ELECTROSURGICAL) ×3 IMPLANT
STAPLER VISISTAT 35W (STAPLE) ×3 IMPLANT
STRIP CLOSURE SKIN 1/2X4 (GAUZE/BANDAGES/DRESSINGS) IMPLANT
SUT MNCRL 0 VIOLET CTX 36 (SUTURE) ×4 IMPLANT
SUT MONOCRYL 0 CTX 36 (SUTURE) ×8
SUT PDS AB 0 CTX 60 (SUTURE) ×3 IMPLANT
SUT PLAIN 0 NONE (SUTURE) IMPLANT
SUT PLAIN 2 0 (SUTURE)
SUT PLAIN 2 0 XLH (SUTURE) ×3 IMPLANT
SUT PLAIN ABS 2-0 CT1 27XMFL (SUTURE) IMPLANT
SUT VIC AB 4-0 KS 27 (SUTURE) ×3 IMPLANT
SYR BULB 3OZ (MISCELLANEOUS) ×3 IMPLANT
SYRINGE 10CC LL (SYRINGE) ×3 IMPLANT
TOWEL OR 17X24 6PK STRL BLUE (TOWEL DISPOSABLE) ×3 IMPLANT
TRAY FOLEY W/BAG SLVR 14FR LF (SET/KITS/TRAYS/PACK) ×3 IMPLANT

## 2017-06-10 NOTE — Anesthesia Postprocedure Evaluation (Signed)
Anesthesia Post Note  Patient: Emily Sharp  Procedure(s) Performed: PRIMARY CESAREAN SECTION MULTI-GESTATIONAL (N/A )     Patient location during evaluation: Mother Baby Anesthesia Type: Spinal Level of consciousness: awake and alert Pain management: pain level controlled Vital Signs Assessment: post-procedure vital signs reviewed and stable Respiratory status: spontaneous breathing, nonlabored ventilation and respiratory function stable Cardiovascular status: stable Postop Assessment: no headache, no backache, no apparent nausea or vomiting, patient able to bend at knees, able to ambulate, spinal receding and adequate PO intake Anesthetic complications: no    Last Vitals:  Vitals:   06/10/17 1430 06/10/17 1615  BP: (!) 135/94 (!) 142/79  Pulse: 80 80  Resp: 16 16  Temp: 36.8 C 36.9 C  SpO2: 99% 99%    Last Pain:  Vitals:   06/10/17 1615  TempSrc: Oral  PainSc:    Pain Goal:                 Land O'LakesMalinova,Dalaney Needle Hristova

## 2017-06-10 NOTE — Op Note (Signed)
NAME: Emily Sharp, Emily Sharp MEDICAL RECORD [id number] ACCOUNT [id number] DATE OF BIRTH:07/30/1980 FACILITY: [facility] LOCATION: [facility] PHYSICIAN:Grayson White Jamal Collin II, MD  OPERATIVE REPORT  DATE OF PROCEDURE:  06/10/2017  PREOPERATIVE DIAGNOSES: 1.  Twin gestation, breech vertex. 2.  Pregnancy-induced hypertension. 3.  Gestational diabetes.  POSTOPERATIVE DIAGNOSES: 1.  Twin gestation, breech vertex. 2.  Pregnancy-induced hypertension. 3.  Gestational diabetes.  PROCEDURE:  Primary low-transverse cesarean section.  SURGEON:  Harold Hedge II, MD  ASSISTANT:  Janalyn Shy, FNA  ANESTHESIA:  Spinal.  ESTIMATED BLOOD LOSS:  See anesthesia note.  SPECIMENS:  Placentas to pathology.  FINDINGS:  Baby A viable female infant.  Apgars and arterial cord pH and birth weight pending.  Baby B viable female infant.  Apgars and arterial cord pH and birth weight pending.  INDICATIONS AND CONSENT:  This patient is a 37 year old G2 P0 at 37-3/7 weeks with an IVF twin gestation.  Babies are noted to be breech vertex, on a bedside ultrasound, breech breech.  She has had gestational diabetes with diet control.  Blood pressures  have become more labile with 140s over upper 80s and 90s noted.  Cesarean section has been reviewed preoperatively.  The patient also had a previous abdominal midline incision with mesh placement for hernia repair.  Potential risks and complications  were discussed with the patient preoperatively, including but not limited to infection, organ damage, bleeding requiring transfusion of blood products with HIV and hepatitis acquisition, DVT, PE, pneumonia, return to the operating room.  The patient  stated she understood and agreed, and consent was signed and in the chart.  DESCRIPTION OF PROCEDURE:  The patient was taken to the operating room where she was identified.  Spinal anesthetic was placed per anesthesiology, and she was placed in the dorsal supine position with a  15-degree left lateral wedge.  She was prepped  vaginally, and a Foley catheter was placed with a Betadine prep.  She also has a small superficial skin abscess on the upper right thigh, which was covered with a dressing.  A Traxi was used to elevate the panniculus, and she was prepped abdominally with  ChloraPrep.  After 3-minute drying time, she was draped in a sterile fashion.  Timeout was undertaken.  She was tested for adequate spinal anesthesia, and skin was entered through a Pfannenstiel incision.  Dissection was carried out in layers to the  peritoneum which was incised and extended superiorly and inferiorly.  The mesh was not encountered.  The vesicouterine peritoneum was taken down cephalad laterally.  Bladder flap developed.  The bladder blade was placed.  The uterus was incised in a low  transverse manner.  The uterine cavity was entered bluntly with a hemostat.  The uterine incision was extended with the fingers.  Clear fluid was noted.  Baby was delivered from the double footling breech position without difficulty.  Good cry and tone  were noted after 1-minute waiting time.  The cord was clamped and cut, and the baby was handed to waiting pediatrics team.  Baby B was vertex.  Artificial rupture of membranes for clear fluid was done.  The vertex and the baby were then delivered without  difficulty.  There was a loose nuchal cord x1 around baby B.  Good cry and tone were noted.  After a 1-minute waiting time, the cord was clamped and cut, and the baby was handed to waiting pediatrics team.  After drawing the blood from both cords, baby  A's cord was marked with a plastic  umbilical cord clamp.  Placentas were then delivered.  Sent to pathology.  Uterine cavity was thoroughly explored and was clean.  The uterus was closed in 2 running locking imbricating layers of 0 Monocryl suture, which  achieved good hemostasis.  The left ovary appeared normal, and the right ovary and right fallopian tube  appeared normal.  Lavage was carried out.  Anterior peritoneum was closed in a running fashion with a 0 Monocryl suture, which was also used to  reapproximate the pyramidalis muscle in the midline.  Anterior rectus fascia was closed in a running fashion with a 0 looped PDS.  Subcutaneous layer was closed with interrupted plain and the skin was closed with clips.  A PICO dressing was applied.  All  counts were correct.  The patient was transferred to the recovery room in stable condition.  LN/NUANCE  D:06/10/2017 T:06/10/2017 JOB:[id number]

## 2017-06-10 NOTE — Addendum Note (Signed)
Addendum  created 06/10/17 1712 by Elgie CongoMalinova, Aubriee Szeto H, CRNA   Sign clinical note

## 2017-06-10 NOTE — Anesthesia Procedure Notes (Signed)
Spinal  Patient location during procedure: OB Start time: 06/10/2017 9:59 AM End time: 06/10/2017 10:05 AM Staffing Anesthesiologist: Trevor IhaHouser, Stephen A, MD Performed: anesthesiologist  Preanesthetic Checklist Completed: patient identified, surgical consent, pre-op evaluation, timeout performed, IV checked, risks and benefits discussed and monitors and equipment checked Spinal Block Patient position: sitting Prep: site prepped and draped and DuraPrep Patient monitoring: heart rate, cardiac monitor, continuous pulse ox and blood pressure Approach: midline Location: L3-4 Injection technique: single-shot Needle Needle type: Pencan  Needle gauge: 24 G Needle length: 10 cm Assessment Sensory level: T4

## 2017-06-10 NOTE — Lactation Note (Signed)
This note was copied from a baby's chart. Lactation Consultation Note  Patient Name: Emily Sharp Emily Sharp ZOXWR'UToday's Date: 06/10/2017 Reason for consult: Initial assessment;Multiple gestation;Early term 37-38.6wks;Breast reduction;1st time breastfeeding;Primapara;Maternal endocrine disorder Type of Endocrine Disorder?: PCOS  Baby A Not seen by lactation yet, he's in NICU  Baby B 12 hours old early term twin female who is being exclusively BF by her mother, she's a P1. Mom has a history of PCOS and a breast reduction at 37 y.o. RN was already putting baby to the breast when entering the room, LC took over latch assistance. RN fit mom up with a NS # 24 due to difficult latch. When baby is at the breast he'll suck vigorously, but no swallows were heard, baby was dimpling had to reposition. Once baby was repositioned, dimpling disappeared but baby still getting nipple (with NS) in and out of his mouth at every suck due to non-compressible tissue, nipples invert upon suction or compression.  Mom Mom has flat nipples with non-compressible tissue. Both nipples looked intact upon examination with no signs of trauma. Per mom feedings at the breast are comfortable, when baby was done feeding off the left breast only saliva was noticed on the NS. Made parents aware of non nutritive sucking and instructed the to limit feedings at the breast to no more then 30 minutes according to late preterm protocol.  Mom already has breast shells and she's double pumping, she pumped twice today but no colostrum seen with pumping, LC also tried to hand express and no colostrum seen either. Mom will start wearing shells tomorrow, instructions, cleaning and storage were reviewed.  Encouraged mom to keep feeding baby 8-12 times/24 hours or sooner if feeding cues are present. If baby is not cueing in a 3 hour period mom will put him STS to the breast to give him an opportunity to feed. Mom will pump every 3 hours after feedings If  baby is still showing hunger cues or reaches the 24 hour mark parents are aware that supplementation may be necessary and mother's milk will be our first choice. If no mother's milk is available, baby will be supplemented with Similac 19 calorie formula, like twin brother A. BF brochure, BF resources and feeding diary were reviewed, both parents are aware of LC services and will call PRN.  Maternal Data Formula Feeding for Exclusion: Yes Reason for exclusion: Previous breast surgery (mastectomy, reduction, or augmentation where mother is unable to produce breast milk) Has patient been taught Hand Expression?: Yes Does the patient have breastfeeding experience prior to this delivery?: No  Feeding Feeding Type: Breast Fed Length of feed: 2 min  LATCH Score Latch: Too sleepy or reluctant, no latch achieved, no sucking elicited.  Audible Swallowing: A few with stimulation  Type of Nipple: Flat  Comfort (Breast/Nipple): Soft / non-tender  Hold (Positioning): Full assist, staff holds infant at breast  LATCH Score: 4  Interventions Interventions: Breast feeding basics reviewed;Assisted with latch(Nipple Shield given by Alba CoryMichelle H. RN)  Lactation Tools Discussed/Used Tools: Pump;Nipple Dorris CarnesShields;Shells Nipple shield size: 24 Shell Type: Inverted Breast pump type: Double-Electric Breast Pump WIC Program: No Pump Review: Setup, frequency, and cleaning Initiated by:: RN Date initiated:: 06/10/17   Consult Status Consult Status: Follow-up Date: 06/11/17 Follow-up type: In-patient    Nikolina Simerson Venetia ConstableS Eulla Kochanowski 06/10/2017, 10:56 PM

## 2017-06-10 NOTE — Anesthesia Postprocedure Evaluation (Signed)
Anesthesia Post Note  Patient: Emily Sharp  Procedure(s) Performed: PRIMARY CESAREAN SECTION MULTI-GESTATIONAL (N/A )     Patient location during evaluation: PACU Anesthesia Type: Spinal Level of consciousness: oriented and awake and alert Pain management: pain level controlled Vital Signs Assessment: post-procedure vital signs reviewed and stable Respiratory status: spontaneous breathing, respiratory function stable and patient connected to nasal cannula oxygen Cardiovascular status: blood pressure returned to baseline and stable Postop Assessment: no headache, no backache and no apparent nausea or vomiting Anesthetic complications: no    Last Vitals:  Vitals:   06/10/17 1231 06/10/17 1330  BP: 129/71 134/79  Pulse: 64 61  Resp: 18 18  Temp: 36.7 C 36.9 C  SpO2: 97% 99%    Last Pain:  Vitals:   06/10/17 1330  TempSrc: Oral  PainSc: 3    Pain Goal:                 Trevor IhaStephen A Cipriano Millikan

## 2017-06-10 NOTE — Progress Notes (Signed)
1200 PACU CBG 101

## 2017-06-10 NOTE — Brief Op Note (Signed)
06/10/2017  10:58 AM  PATIENT:  Emily Sharp  37 y.o. female  PRE-OPERATIVE DIAGNOSIS:  primary,twins, history of hernia/mesh surgery  POST-OPERATIVE DIAGNOSIS:  primary,twins, history of hernia/mesh surgery  PROCEDURE:  Procedure(s) with comments: PRIMARY CESAREAN SECTION MULTI-GESTATIONAL (N/A) - [name], RNFA  SURGEON:  Surgeon(s) and Role:    * Harold Hedgeomblin, Tremain Rucinski, MD - Primary  PHYSICIAN ASSISTANT:   ASSISTANTS: Theatre managerHeather Kreitmeyer   ANESTHESIA:   spinal  EBL:  400 mL   BLOOD ADMINISTERED:none  DRAINS: Urinary Catheter (Foley)   LOCAL MEDICATIONS USED:  NONE  SPECIMEN:  Source of Specimen:  placentas  DISPOSITION OF SPECIMEN:  PATHOLOGY  COUNTS:  YES  TOURNIQUET:  * No tourniquets in log *  DICTATION: .Other Dictation: Dictation Number [id number]  PLAN OF CARE: Admit to inpatient   PATIENT DISPOSITION:  PACU - hemodynamically stable.   Delay start of Pharmacological VTE agent (>24hrs) due to surgical blood loss or risk of bleeding: not applicable

## 2017-06-10 NOTE — Progress Notes (Signed)
No changes to H&P per patient history No HA, no vision change, no epigastric pain Reviewed with patient procedure and risks-cesarean section All questions answered Patient states she understands and agrees

## 2017-06-10 NOTE — Transfer of Care (Signed)
Immediate Anesthesia Transfer of Care Note  Patient: Emily Sharp  Procedure(s) Performed: PRIMARY CESAREAN SECTION MULTI-GESTATIONAL (N/A )  Patient Location: PACU  Anesthesia Type:Spinal  Level of Consciousness: awake, alert  and oriented  Airway & Oxygen Therapy: Patient Spontanous Breathing  Post-op Assessment: Report given to RN and Post -op Vital signs reviewed and stable  Post vital signs: Reviewed and stable  Last Vitals:  Vitals Value Taken Time  BP 134/75 06/10/2017 11:16 AM  Temp    Pulse 85 06/10/2017 11:18 AM  Resp 7 06/10/2017 11:18 AM  SpO2 97 % 06/10/2017 11:18 AM  Vitals shown include unvalidated device data.  Last Pain:  Vitals:   06/10/17 0802  TempSrc: Oral         Complications: No apparent anesthesia complications

## 2017-06-11 ENCOUNTER — Other Ambulatory Visit: Payer: BC Managed Care – PPO

## 2017-06-11 LAB — CBC
HEMATOCRIT: 25.4 % — AB (ref 36.0–46.0)
Hemoglobin: 7.9 g/dL — ABNORMAL LOW (ref 12.0–15.0)
MCH: 26.4 pg (ref 26.0–34.0)
MCHC: 31.1 g/dL (ref 30.0–36.0)
MCV: 84.9 fL (ref 78.0–100.0)
PLATELETS: 112 10*3/uL — AB (ref 150–400)
RBC: 2.99 MIL/uL — ABNORMAL LOW (ref 3.87–5.11)
RDW: 15.6 % — AB (ref 11.5–15.5)
WBC: 8.8 10*3/uL (ref 4.0–10.5)

## 2017-06-11 LAB — GLUCOSE, CAPILLARY
GLUCOSE-CAPILLARY: 102 mg/dL — AB (ref 65–99)
Glucose-Capillary: 131 mg/dL — ABNORMAL HIGH (ref 65–99)

## 2017-06-11 MED ORDER — FERROUS SULFATE 325 (65 FE) MG PO TABS
325.0000 mg | ORAL_TABLET | Freq: Two times a day (BID) | ORAL | Status: DC
  Administered 2017-06-11 – 2017-06-13 (×5): 325 mg via ORAL
  Filled 2017-06-11 (×5): qty 1

## 2017-06-11 NOTE — Progress Notes (Signed)
Subjective: Postpartum Day 1: Cesarean Delivery Patient reports tolerating PO and no problems voiding.  One baby in NICU for respiratory support.  Desires circ.  No HA, CP/SOB, RUQ pain, or vision change.    Objective: Vital signs in last 24 hours: Temp:  [97.7 F (36.5 C)-98.7 F (37.1 C)] 98.7 F (37.1 C) (06/05 0622) Pulse Rate:  [61-85] 80 (06/05 0622) Resp:  [14-23] 16 (06/05 0622) BP: (111-142)/(61-94) 126/76 (06/05 0622) SpO2:  [95 %-100 %] 98 % (06/05 0622)  Physical Exam:  General: alert, cooperative and appears stated age 29Lochia: appropriate Uterine Fundus: firm Incision: healing well, no significant drainage, no dehiscence DVT Evaluation: No evidence of DVT seen on physical exam. Negative Homan's sign. No cords or calf tenderness.  Recent Labs    06/09/17 1057 06/11/17 0549  HGB 9.8* 7.9*  HCT 32.2* 25.4*    Assessment/Plan: Status post Cesarean section. Doing well postoperatively.  Pre-eclampsia-PLT 112.  Will repeat labs tomorrow AM.  No symptoms.  BPs with rare mild range.   Chronic anemia-FeSO4 Continue current care. Patient counseled for circ including risk of bleeding, infection, and scarring.  All questions were answered.  Raden Byington 06/11/2017, 9:04 AM

## 2017-06-12 LAB — COMPREHENSIVE METABOLIC PANEL
ALBUMIN: 2.4 g/dL — AB (ref 3.5–5.0)
ALT: 11 U/L — AB (ref 14–54)
AST: 30 U/L (ref 15–41)
Alkaline Phosphatase: 123 U/L (ref 38–126)
Anion gap: 10 (ref 5–15)
BUN: 9 mg/dL (ref 6–20)
CHLORIDE: 108 mmol/L (ref 101–111)
CO2: 20 mmol/L — AB (ref 22–32)
Calcium: 8.3 mg/dL — ABNORMAL LOW (ref 8.9–10.3)
Creatinine, Ser: 0.62 mg/dL (ref 0.44–1.00)
GFR calc non Af Amer: >60 mL/min (ref >60)
GLUCOSE: 73 mg/dL (ref 65–99)
Potassium: 3.9 mmol/L (ref 3.5–5.1)
SODIUM: 138 mmol/L (ref 135–145)
Total Bilirubin: <0.1 mg/dL — ABNORMAL LOW (ref 0.3–1.2)
Total Protein: 5.4 g/dL — ABNORMAL LOW (ref 6.5–8.1)

## 2017-06-12 LAB — CBC
HCT: 24.4 % — ABNORMAL LOW (ref 36.0–46.0)
Hemoglobin: 7.6 g/dL — ABNORMAL LOW (ref 12.0–15.0)
MCH: 26.7 pg (ref 26.0–34.0)
MCHC: 31.1 g/dL (ref 30.0–36.0)
MCV: 85.6 fL (ref 78.0–100.0)
PLATELETS: 137 10*3/uL — AB (ref 150–400)
RBC: 2.85 MIL/uL — AB (ref 3.87–5.11)
RDW: 15.8 % — ABNORMAL HIGH (ref 11.5–15.5)
WBC: 7.9 10*3/uL (ref 4.0–10.5)

## 2017-06-12 NOTE — Lactation Note (Signed)
This note was copied from a baby's chart. Lactation Consultation Note Twin B "Willeen CassBennett" mom has been mainly formula feeding. Mom hasn't pumped or hand expressed. Mom stated she has been to tired to pump. Discussed supply and demand. Mom stated she knows. FOB just fed formula while mom sleeping. Discussed taking baby "A" colostrum. Mom stated she hasn't. Stated maybe when she gets home things will be better. Baby "A" is NPO at this time. Still encouraged mom to hand express and pump to obtain supplement.  Mom stated she will and she knows. Mom is putting to the breast some, but feeding formula as well. Encouraged to call for assistance if needed.  Patient Name: Emily Sharp [id number]'XToday's Date: 06/12/2017 Reason for consult: Follow-up assessment;Multiple gestation;Early term 37-38.6wks   Maternal Data    Feeding Feeding Type: Bottle Fed - Formula Nipple Type: Slow - flow  LATCH Score                   Interventions    Lactation Tools Discussed/Used     Consult Status Consult Status: Follow-up Date: 06/13/17 Follow-up type: In-patient    Krystall Kruckenberg, Diamond NickelLAURA G 06/12/2017, 2:20 AM

## 2017-06-12 NOTE — Progress Notes (Signed)
Subjective: Postpartum Day 2: Cesarean Delivery Patient reports incisional pain and tolerating PO.    Objective: Vital signs in last 24 hours: Temp:  [98.1 F (36.7 C)-98.4 F (36.9 C)] 98.1 F (36.7 C) (06/06 0529) Pulse Rate:  [81-87] 87 (06/06 0529) Resp:  [14-17] 14 (06/06 0529) BP: (121-125)/(60-71) 121/71 (06/06 0529) SpO2:  [98 %] 98 % (06/06 0529)  Physical Exam:  General: alert and cooperative Lochia: appropriate Uterine Fundus: firm Incision: healing well, no significant drainage DVT Evaluation: No evidence of DVT seen on physical exam.  Recent Labs    06/11/17 0549 06/12/17 0609  HGB 7.9* 7.6*  HCT 25.4* 24.4*   Plts improved to 136  Assessment/Plan: Status post Cesarean section. Doing well postoperatively.  Continue current care.  Emily Sharp 06/12/2017, 8:56 AM

## 2017-06-13 ENCOUNTER — Ambulatory Visit: Payer: Self-pay

## 2017-06-13 MED ORDER — IBUPROFEN 600 MG PO TABS: 600.0000 mg | ORAL_TABLET | Freq: Four times a day (QID) | ORAL | 0 refills | Status: DC | PRN

## 2017-06-13 MED ORDER — OXYCODONE HCL 5 MG PO TABS: 5.0000 mg | ORAL_TABLET | Freq: Four times a day (QID) | ORAL | 0 refills | Status: DC | PRN

## 2017-06-13 MED ORDER — ACETAMINOPHEN 325 MG PO TABS: 650.0000 mg | ORAL_TABLET | Freq: Four times a day (QID) | ORAL | 1 refills | Status: DC | PRN

## 2017-06-13 MED ORDER — FERROUS SULFATE 325 (65 FE) MG PO TABS: 325.0000 mg | ORAL_TABLET | Freq: Every day | ORAL | 3 refills | Status: DC

## 2017-06-13 NOTE — Discharge Summary (Signed)
Obstetric Discharge Summary Reason for Admission: cesarean section Prenatal Procedures: none Intrapartum Procedures: cesarean: low cervical, transverse Postpartum Procedures: none Complications-Operative and Postpartum: none Hemoglobin  Date Value Ref Range Status  06/12/2017 7.6 (L) 12.0 - 15.0 g/dL Final   HCT  Date Value Ref Range Status  06/12/2017 24.4 (L) 36.0 - 46.0 % Final    Physical Exam:  General: alert, cooperative and no distress Lochia: appropriate Uterine Fundus: firm Incision: healing well DVT Evaluation: No evidence of DVT seen on physical exam.  Discharge Diagnoses: Term Pregnancy-delivered  Discharge Information: Date: 06/13/2017 Activity: pelvic rest Diet: routine Medications: PNV, Ibuprofen and oxycodone Condition: stable Instructions: refer to practice specific booklet Discharge to: home   Newborn Data:   Regent BlasVanderveen, Boy Chequita [[id number]][[id number]]  Live born female  Birth Weight: 8 lb 10.3 oz (3920 g) APGAR: 7, 5  Newborn Delivery   Birth date/time:  06/10/2017 10:26:00 Delivery type:  C-Section, Low Transverse Trial of labor:  No C-section categorization:  Primary      Ronaldo MiyamotoVanderveen, BoyB Aleya [[id number]][[id number]]  Live born female  Birth Weight: 7 lb 3.9 oz (3286 g) APGAR: 8, 9  Newborn Delivery   Birth date/time:  [birth year]/10/2017 10:29:00 Delivery type:  C-Section, Low Transverse Trial of labor:  No C-section categorization:  Primary     Home with mother.  Roselle LocusJames E Kaleeyah Cuffie II 06/13/2017, 9:12 AM

## 2017-06-13 NOTE — Lactation Note (Signed)
This note was copied from a baby's chart. Lactation Consultation Note  Patient Name: Emily Sharp ZOXWR'UToday's Date: 06/13/2017 Reason for consult: Follow-up assessment 72 hour twin baby B 4674w3d help with :  hand expression, NICU booklet given with milk storage and collection explained. Mom demonstrated hand expression and spoon feeding with colostrum present. Infant with deep latched using 20 m nipple shield for 20 minutes.  Mom was informed about engorgement and breast massage, demonstrated breast compressions with Dad assisting. Supplementing with formula after feeding.  Maternal Data    Feeding Feeding Type: Breast Fed Length of feed: 20 min  LATCH Score Latch: Repeated attempts needed to sustain latch, nipple held in mouth throughout feeding, stimulation needed to elicit sucking reflex.  Audible Swallowing: A few with stimulation  Type of Nipple: Flat  Comfort (Breast/Nipple): Soft / non-tender  Hold (Positioning): Assistance needed to correctly position infant at breast and maintain latch.  LATCH Score: 6  Interventions Interventions: Support pillows;Breast massage;Hand express;Breast compression;DEBP;Hand pump  Lactation Tools Discussed/Used Tools: Nipple Shields Nipple shield size: 20 Breast pump type: Double-Electric Breast Pump   Consult Status Consult Status: Follow-up Date: 06/14/17 Follow-up type: In-patient    Danelle EarthlyRobin Antoinette Borgwardt 06/13/2017, 2:02 PM

## 2017-06-13 NOTE — Clinical Social Work Maternal (Signed)
CLINICAL SOCIAL WORK MATERNAL/CHILD NOTE  Patient Details  Name: Emily Sharp MRN: [id number] Date of Birth: 06/25/1980  Date:  06/13/2017  Clinical Social Worker Initiating Note:  Zarya Lasseigne, LCSW      Date/Time: Initiated:  06/13/17/1100             Child's Name:  (A) Emily Sharp and (B) Emily Sharp   Biological Parents:  Mother, Father(Belissa and Jeb Lempke)   Need for Interpreter:  None   Reason for Referral:  Behavioral Health Concerns(NICU admission of twin A)   Address:  [address] [place] [address]    Phone number:  [phone] (home)     Additional phone number:   Household Members/Support Persons (HM/SP):   Household Member/Support Person 1   HM/SP Name Relationship DOB or Age  HM/SP -1 Jeb Demirjian FOB/husband 10/25/75  HM/SP -2        HM/SP -3        HM/SP -4        HM/SP -5        HM/SP -6        HM/SP -7        HM/SP -8          Natural Supports (not living in the home): Friends, Immediate Family, Extended Family   Professional Supports:None   Employment:    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 (per PNR)   Education:      Homebound arranged:    Financial Resources:Private Insurance   Other Resources:     Cultural/Religious Considerations Which May Impact Care:None stated.  Strengths: Ability to meet basic needs , Home prepared for child , Psychotropic Medications, Pediatrician chosen(MOB plans to restart Zoloft now that she has delivered.)   Psychotropic Medications:  Zoloft      Pediatrician:    [place] area  Pediatrician List:   [facility]  [place]    [place]    [place]    [place]    [place]      Pediatrician Fax Number:    Risk Factors/Current Problems: Mental Health Concerns (Hx Anxiety and Depression)   Cognitive State: Able to Concentrate , Alert  , Linear Thinking , Insightful , Goal Oriented    Mood/Affect: Calm , Tearful , Interested    CSW Assessment:CSW met with parents in MOB's first floor room/125 to offer support  And complete assessment due to hx of Anxiety and Depression as well as baby A's admission to NICU for respiratory distress.  Parents were pleasant and welcoming of CSW's visit.   MOB began to cry as soon as CSW explained role or emotional support.  CSW encouraged parents to allow themselves to be emotional and asked them to share their story.  FOB told CSW that the twins are IVF and that they have been trying to have children for 6 years.  FOB became emotional as well as he spoke.  He continued that knowing they were pregnant with twins caused them to think that they may have the NICU experience, but as MOB neared full-term and babies measured nearly 7 pounds each, their fears of the NICU started to disappear.  He states having Emily Sharp need NICU intervention at birth was a shock to him.  He then said that he wasn't sure what to call his emotion, but explained it as "survivor's guilt" that his baby is the biggest in the NICU and next to   tiny babies who will be admitted for much longer than his and for much more significant reasons.  CSW briefly shared own story as it seemed to be similar to what FOB was talking about and seemed appropriate for the moment.  Parents seemed appreciative.  CSW gave permission for parents to embrace their feelings of trauma, reminding them that they are entitled to their feelings.  CSW also encouraged them to think about how grateful they are that their babies were not admitted to the NICU, say, 10 weeks ago and that they aren't facing an extended hospitalization.  Either way, separation from your child is not natural and is difficult.  CSW suggests that they may cry every time they leave from a visit with Emily Sharp and that will be okay.  CSW added that it is difficult to continue to juggle one  baby in the unit and the other not, but encouraged them to frame this as forced one on one bonding before they are a complete family of four.   Although CSW encouraged parents to allow themselves to acknowledge and feel their emotions, CSW also encouraged them to monitor their level of emotion and whether or not they feel negative emotions ever begin to interfere with daily life or their ability to enjoy this time.  CSW also discussed baby blues and the normal feelings of fragility for a mother during the first 1-2 weeks after delivery.  CSW notes that MOB has a hx of Anxiety and Depression and that she was taking Zoloft in the past.  She notes that she stopped just prior to the pregnancy and was just talking with her husband and RN about wanting to resume the medication.  She plans to address this when she calls to make her PP visit.  CSW offered to call the OB today, but FOB states that MOB has about a month supply still, and held up a prescription bottle.  We all agreed, therefore, that MOB can discuss this when she calls later today or tomorrow.  MOB reports that she does not have a counselor.  She was open to resources provided by CSW.  MOB scored a 5 on the Edinburgh Postnatal Depression Scale and CSW encouraged her to continue to utilize this and the New Mom Checklist from [employer] as a way to monitor her mental health during the postpartum time period.  CSW commented on how fathers can be affected by PMADs as well.  Both parents state they have a medical provider with whom they feel comfortable talking if concerns arise.  Parents seemed appreciative of the visit.   CSW explained ongoing support offered by NICU CSW and provided them with contact information.   CSW Plan/Description: No Further Intervention Required/No Barriers to Discharge, Psychosocial Support and Ongoing Assessment of Needs, Perinatal Mood and Anxiety Disorder (PMADs) Education, Other Information/Referral to Community  Resources    Kahlin Mark Elizabeth, LCSW 06/13/2017, 2:25 PM    

## 2017-06-16 ENCOUNTER — Encounter (HOSPITAL_COMMUNITY)
Admission: RE | Admit: 2017-06-16 | Discharge: 2017-06-16 | Disposition: A | Discharge status: Home / Self Care | Payer: BC Managed Care – PPO | Source: Ambulatory Visit | Attending: Obstetrics and Gynecology | Admitting: Obstetrics and Gynecology

## 2017-06-16 HISTORY — DX: Gestational diabetes mellitus in pregnancy, unspecified control: O24.419

## 2017-06-16 HISTORY — DX: Unspecified asthma, uncomplicated: J45.909

## 2019-01-11 ENCOUNTER — Other Ambulatory Visit: Payer: Self-pay

## 2019-01-11 ENCOUNTER — Ambulatory Visit: Payer: Self-pay | Attending: Internal Medicine

## 2019-01-11 DIAGNOSIS — Z20822 Contact with and (suspected) exposure to covid-19: Secondary | ICD-10-CM | POA: Insufficient documentation

## 2019-01-12 LAB — NOVEL CORONAVIRUS, NAA: SARS-CoV-2, NAA: NOT DETECTED

## 2019-10-10 IMAGING — US US MFM OB FOLLOW-UP EACH ADDL GEST (MODIFY)
1 series · 12 of 28 positions shown · non-contrast
Comparison: none

[Series 1: us mfm ob follow-up each addl gest (modify) · 85 acquisitions, 12 frames shown]
[im 4/85]
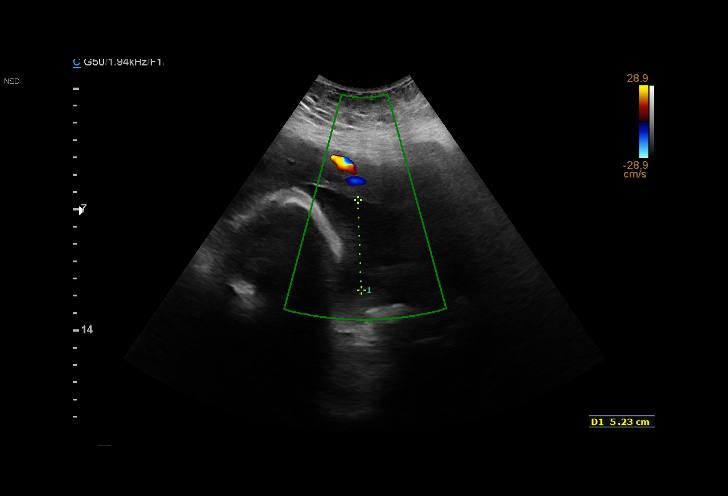
[im 10/85]
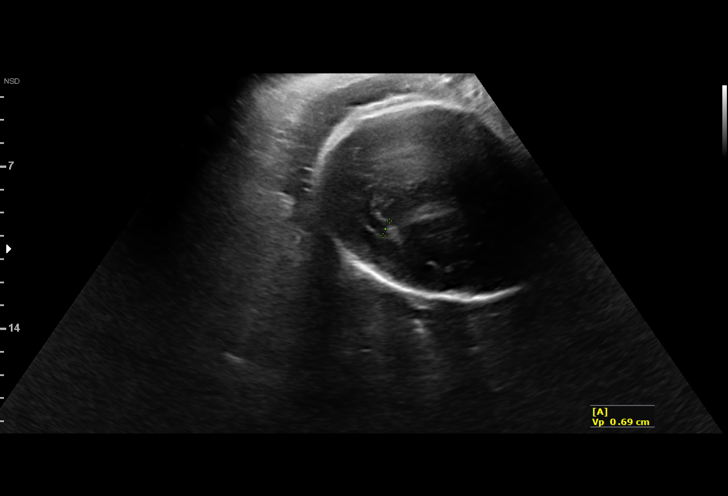
[im 16/85]
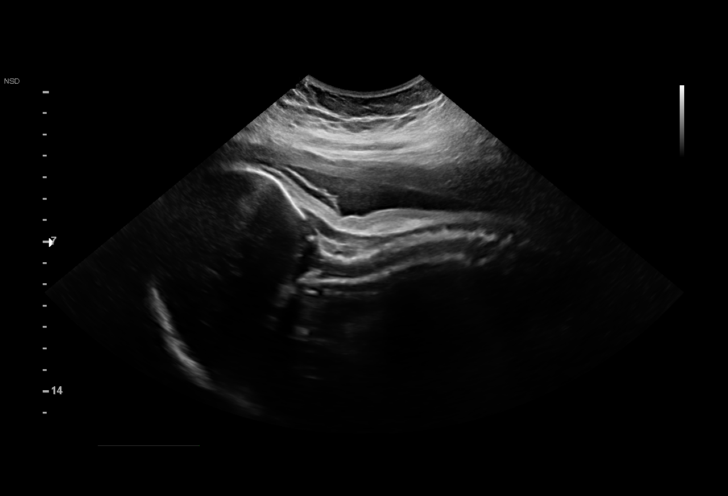
[im 25/85]
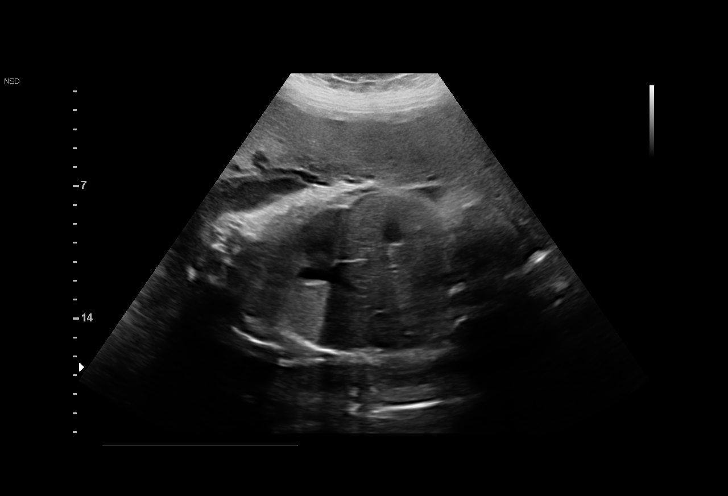
[im 32/85]
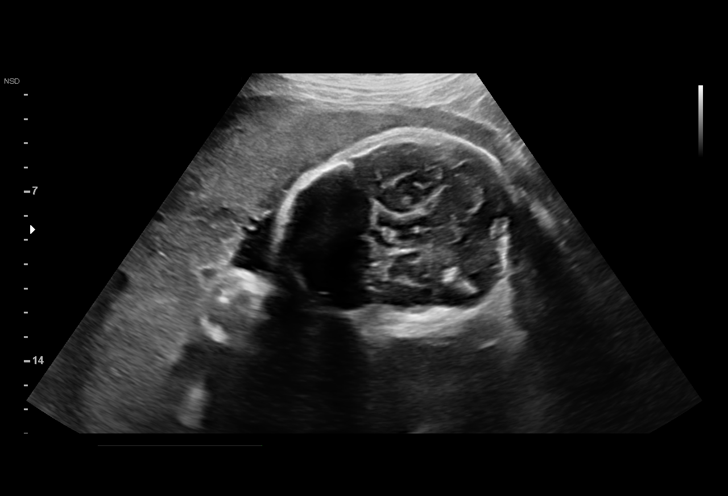
[im 38/85]
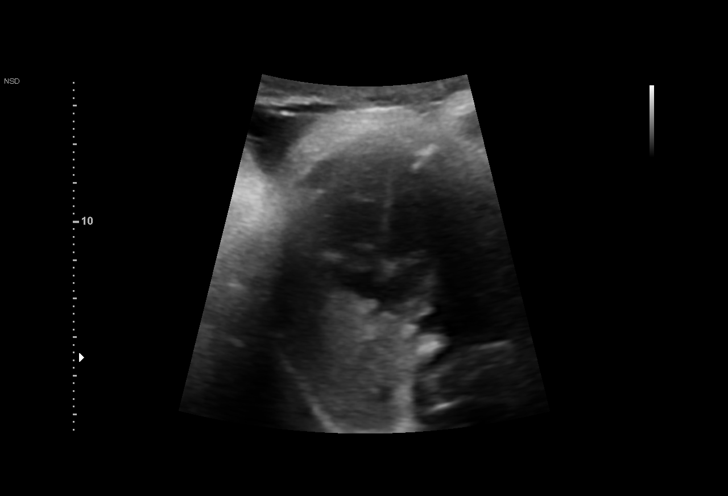
[im 47/85]
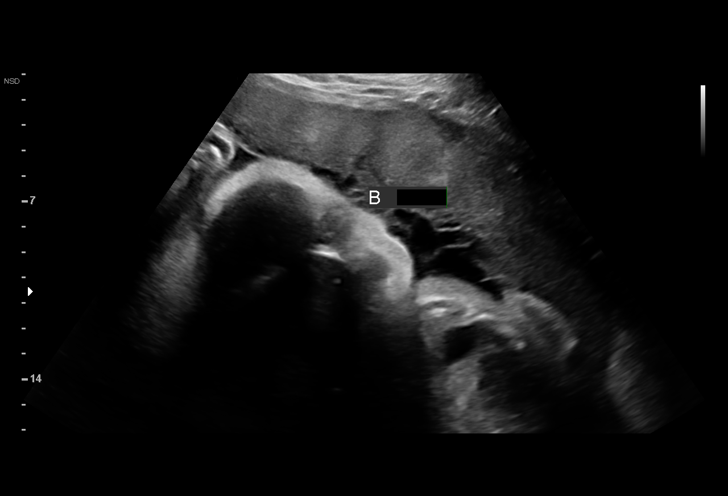
[im 53/85]
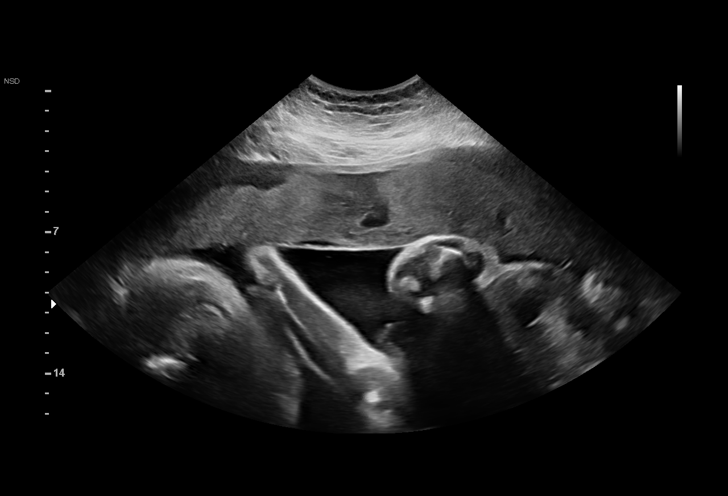
[im 60/85]
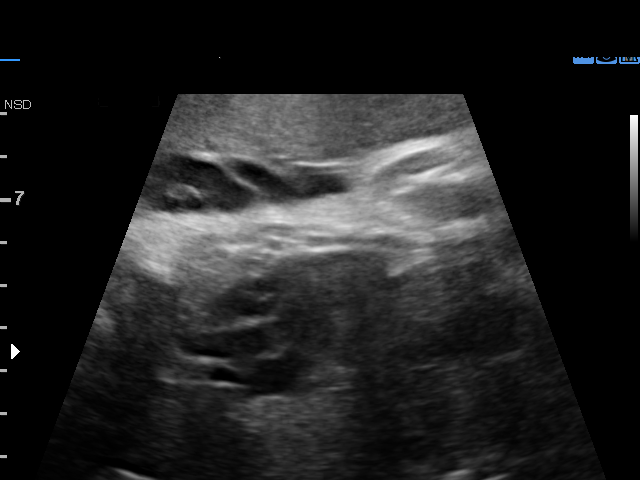
[im 69/85]
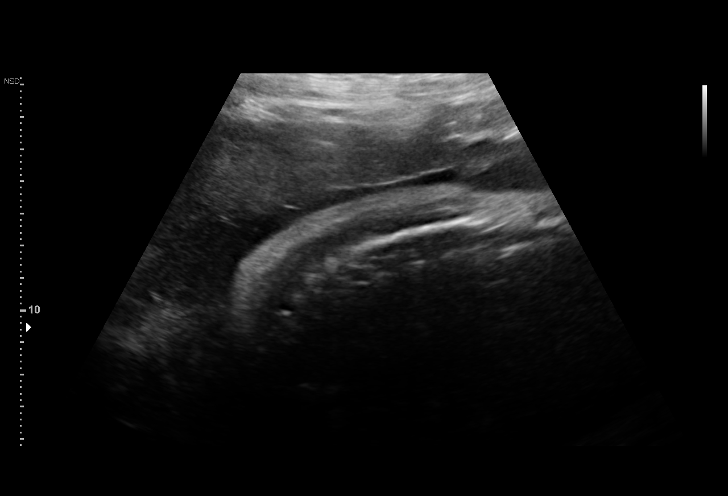
[im 75/85]
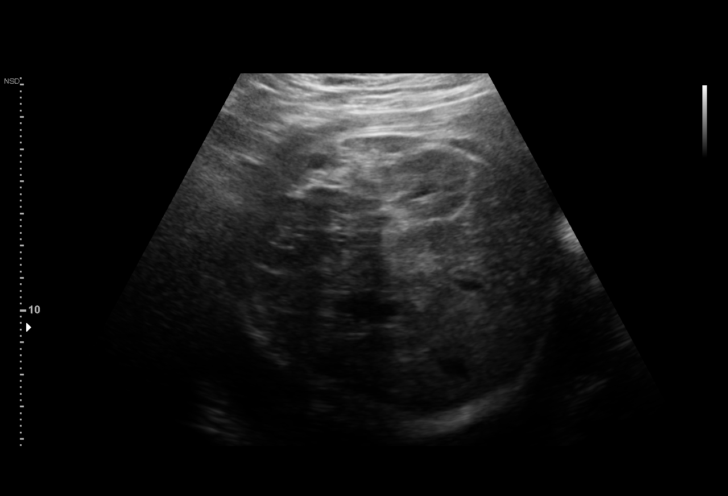
[im 81/85]
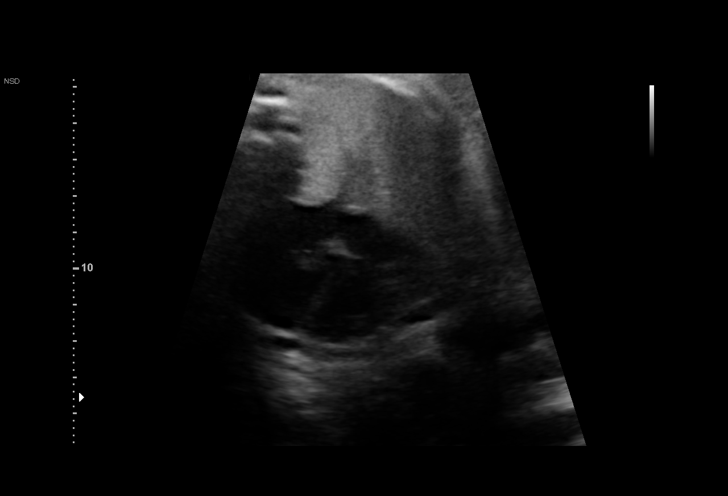

[12 of 28 positions shown; findings below may reference images not displayed]

[REDACTED]

1  MOLAHLEGI BOARDMAN              [id number]      [id number]     [id number]
2  MOLAHLEGI BOARDMAN              [id number]      [id number]     [id number]
Indications

31 weeks gestation of pregnancy
Pregnancy resulting from assisted
reproductive technology
Maternal morbid obesity
Asthma                                         FGG.EG j32.252
Medical complication of pregnancy (Hx
abdominal hernia with mesh) scheduled for
general surgery consult, possible c-sect due
to mesh
Twin pregnancy, di/di, third trimester
Advanced maternal age primigravida 35+,
third trimester; low risk NIPS
OB History

Blood Type:            Height:  5'8"   Weight (lb):  295       BMI:
Gravidity:    2          SAB:   1
Living:       0
Fetal Evaluation (Fetus A)

Num Of Fetuses:     2
Fetal Heart         129
Rate(bpm):
Cardiac Activity:   Observed
Fetal Lie:          Maternal right side
Presentation:       Breech
Placenta:           Anterior, above cervical os
P. Cord Insertion:  Previously seen as normal
Membrane Desc:      Dividing Membrane seen - Dichorionic.
Amniotic Fluid
AFI FV:      Subjectively within normal limits

Largest Pocket(cm)
5.2
Biometry (Fetus A)

BPD:      80.9  mm     G. Age:  32w 3d         65  %    CI:        69.64   %    70 - 86
FL/HC:      20.5   %    19.1 -
HC:      309.4  mm     G. Age:  34w 4d         85  %    HC/AC:      1.05        0.96 -
AC:      294.1  mm     G. Age:  33w 3d         89  %    FL/BPD:     78.5   %    71 - 87
FL:       63.5  mm     G. Age:  32w 6d         67  %    FL/AC:      21.6   %    20 - 24
Est. FW:    5990  gm    4 lb 12 oz      80  %     FW Discordancy         3  %
Gestational Age (Fetus A)

U/S Today:     33w 2d                                        EDD:   06/17/17
Best:          31w 5d     Det. By:  D.O. Conception          EDD:   06/28/17
Anatomy (Fetus A)

Cranium:               Appears normal         Aortic Arch:            Not well visualized
Cavum:                 Previously seen        Ductal Arch:            Not well visualized
Ventricles:            Appears normal         Diaphragm:              Appears normal
Choroid Plexus:        Appears normal         Stomach:                Appears normal, left
sided
Cerebellum:            Previously seen        Abdomen:                Appears normal
Posterior Fossa:       Previously seen        Abdominal Wall:         Not well visualized
Nuchal Fold:           Previously seen        Cord Vessels:           Previously seen
Face:                  Orbits previously      Kidneys:                Appear normal
seen
Lips:                  Previously seen        Bladder:                Appears normal
Thoracic:              Appears normal         Spine:                  Limited views
appear normal
Heart:                 Appears normal         Upper Extremities:      Left Visualized
(4CH, axis, and situs
RVOT:                  Previously seen        Lower Extremities:      Appears normal
LVOT:                  Appears normal

Other:  Male gender. Technically difficult due to maternal habitus.

Fetal Evaluation (Fetus B)

Num Of Fetuses:     2
Fetal Heart         122
Rate(bpm):
Cardiac Activity:   Observed
Fetal Lie:          Maternal left side
Presentation:       Breech
Placenta:           Anterior, above cervical os
P. Cord Insertion:  Previously Visualized

Amniotic Fluid
AFI FV:      Subjectively within normal limits

Largest Pocket(cm)
6.0
Biometry (Fetus B)

BPD:      80.6  mm     G. Age:  32w 2d         61  %    CI:        72.04   %    70 - 86
FL/HC:      20.2   %    19.1 -
HC:      302.2  mm     G. Age:  33w 4d         63  %    HC/AC:      0.98        0.96 -
AC:      309.2  mm     G. Age:  34w 6d       > 97  %    FL/BPD:     75.6   %    71 - 87
FL:       60.9  mm     G. Age:  31w 5d         35  %    FL/AC:      19.7   %    20 - 24

Est. FW:    5500  gm    4 lb 15 oz      82  %     FW Discordancy      0 \ 3 %
Gestational Age (Fetus B)

U/S Today:     33w 1d                                        EDD:   06/18/17
Best:          31w 5d     Det. By:  D.O. Conception          EDD:   06/28/17
Anatomy (Fetus B)

Cranium:               Appears normal         Aortic Arch:            Not well visualized
Cavum:                 Previously seen        Ductal Arch:            Not well visualized
Ventricles:            Appears normal         Diaphragm:              Appears normal
Choroid Plexus:        Appears normal         Stomach:                Appears normal, left
sided
Cerebellum:            Appears normal         Abdomen:                Appears normal
Posterior Fossa:       Appears normal         Abdominal Wall:         Not well visualized
Nuchal Fold:           Not applicable (>20    Cord Vessels:           Appears normal (3
wks GA)                                        vessel cord)
Face:                  Orbits and profile     Kidneys:                Appear normal
previously seen
Lips:                  Appears normal         Bladder:                Appears normal
Thoracic:              Appears normal         Spine:                  Previously seen
Heart:                 Appears normal         Upper Extremities:      Rt appears normal
(4CH, axis, and situs
RVOT:                  Appears normal         Lower Extremities:      Previously seen
LVOT:                  Appears normal

Other:  Male gender. Technically difficult due to maternal habitus.
Cervix Uterus Adnexa

Cervix
Not visualized (advanced GA >87wks)
Impression

Dichorionic/diamniotic twin pregnancy at 31+5 weeks with
cardiac activity x 2
Normal interval anatomy x 2; anatomic survey complete
except for arches and CIs on both fetuses
Normal amniotic fluid volume x 2
Appropriate interval growths with EFWs at the 80th %tile and
82nd %tiles
Recommendations

Follow-up ultrasound for growth in 4 weeks - scheduled here
but feel free to perform in the office

## 2020-05-25 ENCOUNTER — Encounter: Payer: Self-pay | Admitting: Physician Assistant

## 2020-05-25 ENCOUNTER — Telehealth: Payer: BC Managed Care – PPO | Admitting: Physician Assistant

## 2020-05-25 DIAGNOSIS — A084 Viral intestinal infection, unspecified: Secondary | ICD-10-CM

## 2020-05-25 MED ORDER — ONDANSETRON 4 MG PO TBDP: 4.0000 mg | ORAL_TABLET | Freq: Three times a day (TID) | ORAL | 0 refills | Status: DC | PRN

## 2020-05-25 NOTE — Progress Notes (Signed)
Ms. salihah, peckham are scheduled for a virtual visit with your provider today.    Just as we do with appointments in the office, we must obtain your consent to participate.  Your consent will be active for this visit and any virtual visit you may have with one of our providers in the next 365 days.    If you have a MyChart account, I can also send a copy of this consent to you electronically.  All virtual visits are billed to your insurance company just like a traditional visit in the office.  As this is a virtual visit, video technology does not allow for your provider to perform a traditional examination.  This may limit your provider's ability to fully assess your condition.  If your provider identifies any concerns that need to be evaluated in person or the need to arrange testing such as labs, EKG, etc, we will make arrangements to do so.    Although advances in technology are sophisticated, we cannot ensure that it will always work on either your end or our end.  If the connection with a video visit is poor, we may have to switch to a telephone visit.  With either a video or telephone visit, we are not always able to ensure that we have a secure connection.   I need to obtain your verbal consent now.   Are you willing to proceed with your visit today?   Rashauna Tep has provided verbal consent on 05/25/2020 for a virtual visit (video or telephone).   Margaretann Loveless, PA-C 05/25/2020  5:10 PM    MyChart Video Visit    Virtual Visit via Video Note   This visit type was conducted due to national recommendations for restrictions regarding the COVID-19 Pandemic (e.g. social distancing) in an effort to limit this patient's exposure and mitigate transmission in our community. This patient is at least at moderate risk for complications without adequate follow up. This format is felt to be most appropriate for this patient at this time. Physical exam was limited by quality of the video and  audio technology used for the visit.   Patient location: Home Provider location: Home office in [place]  I discussed the limitations of evaluation and management by telemedicine and the availability of in person appointments. The patient expressed understanding and agreed to proceed.  Patient: Emily Sharp   DOB: May 26, 1980   40 y.o. Female  MRN: [id number] Visit Date: 05/25/2020  Today's healthcare provider: Margaretann Loveless, PA-C   No chief complaint on file.  Subjective    HPI  Diamonds Lippard is a 40 yr old female that presents today via [facility] for nausea, low grade fever and dizziness. Symptoms started Tuesday morning with nausea, vomiting, and diarrhea. Had been exposed to a viral GI bug at school, multiple people with symptoms. Felt better Wednesday. Then developed a low grade fever (99.7) today and severe nausea today, 05/25/20, no appetite. No vomiting or diarrhea.  Patient Active Problem List   Diagnosis Date Noted  . Multiple gestation 06/10/2017  . PIH (pregnancy induced hypertension) 06/10/2017  . Gestational diabetes 06/10/2017  . H/O cesarean section 06/10/2017  . Dichorionic diamniotic twin pregnancy, antepartum 05/06/2017  . Abnormal glucose tolerance test (GTT) during pregnancy, antepartum 04/25/2017  . Bradycardia 05/09/2016  . Abnormal ECG 05/09/2016  . Sleep apnea 05/09/2016   Past Medical History:  Diagnosis Date  . Anxiety and depression   . Asthma   . Encounter for assisted reproductive fertility  procedure cycle   . Encounter for fertility testing   . Female infertility associated with anovulation   . Gestational diabetes   . PCOS (polycystic ovarian syndrome)   . Polyp of corpus uteri       Medications: Outpatient Medications Prior to Visit  Medication Sig  . acetaminophen (TYLENOL) 325 MG tablet Take 2 tablets (650 mg total) by mouth every 6 (six) hours as needed (for pain scale < 4).  . albuterol (PROVENTIL HFA;VENTOLIN HFA)  108 (90 Base) MCG/ACT inhaler Inhale 2 puffs into the lungs every 4 (four) hours as needed for wheezing or shortness of breath.  . ferrous sulfate 325 (65 FE) MG tablet Take 1 tablet (325 mg total) by mouth daily with breakfast.  . fluticasone (FLONASE) 50 MCG/ACT nasal spray Place 1 spray into the nose daily as needed for allergies.   Marland Kitchen ibuprofen (ADVIL,MOTRIN) 600 MG tablet Take 1 tablet (600 mg total) by mouth every 6 (six) hours as needed.  Marland Kitchen oxyCODONE (OXY IR/ROXICODONE) 5 MG immediate release tablet Take 1 tablet (5 mg total) by mouth every 6 (six) hours as needed (pain scale 4-7).  . Prenatal Vit-Fe Fumarate-FA (PRENATAL VITAMIN PO) Take 1 tablet by mouth daily.    No facility-administered medications prior to visit.    Review of Systems  Constitutional: Positive for appetite change, fatigue and fever (low grade less than 100).  HENT: Negative.   Respiratory: Negative.   Cardiovascular: Negative.   Gastrointestinal: Positive for abdominal distention, abdominal pain and nausea. Negative for diarrhea and vomiting.       Had vomiting and diarrhea initially, but has since resolved  Neurological: Positive for dizziness, light-headedness and headaches.    Last CBC Lab Results  Component Value Date   WBC 7.9 06/12/2017   HGB 7.6 (L) 06/12/2017   HCT 24.4 (L) 06/12/2017   MCV 85.6 06/12/2017   MCH 26.7 06/12/2017   RDW 15.8 (H) 06/12/2017   PLT 137 (L) 06/12/2017   Last metabolic panel Lab Results  Component Value Date   GLUCOSE 73 06/12/2017   NA 138 06/12/2017   K 3.9 06/12/2017   CL 108 06/12/2017   CO2 20 (L) 06/12/2017   BUN 9 06/12/2017   CREATININE 0.62 06/12/2017   GFRNONAA >60 06/12/2017   GFRAA >60 06/12/2017   CALCIUM 8.3 (L) 06/12/2017   PROT 5.4 (L) 06/12/2017   ALBUMIN 2.4 (L) 06/12/2017   BILITOT <0.1 (L) 06/12/2017   ALKPHOS 123 06/12/2017   AST 30 06/12/2017   ALT 11 (L) 06/12/2017   ANIONGAP 10 06/12/2017      Objective    There were no vitals  taken for this visit. BP Readings from Last 3 Encounters:  06/13/17 133/71  06/05/17 125/80  05/27/17 131/75   Wt Readings from Last 3 Encounters:  06/06/17 (!) 315 lb (142.9 kg)  06/10/17 (!) 319 lb 6.4 oz (144.9 kg)  05/15/17 (!) 305 lb (138.3 kg)      Physical Exam Vitals reviewed.  Constitutional:      General: She is not in acute distress.    Appearance: Normal appearance. She is well-developed. She is ill-appearing.  HENT:     Head: Normocephalic and atraumatic.  Pulmonary:     Effort: Pulmonary effort is normal. No respiratory distress.  Musculoskeletal:     Cervical back: Normal range of motion and neck supple.  Neurological:     Mental Status: She is alert.  Psychiatric:        Mood and  Affect: Mood normal.        Behavior: Behavior normal.        Thought Content: Thought content normal.        Judgment: Judgment normal.        Assessment & Plan     1. Viral gastroenteritis - Suspect viral gastroenteritis; discussed suspected course with self resolution in no more than 7-10 days - Zofran given as below for nausea - May use tylenol for body aches and fevers, asked to avoid NSAIDs as they may worsen upset stomach and stomach cramping - Push fluids, add electrolyte based drink of choice - Bland (BRAT) diet and increase as tolerated - Seek in-person evaluation if symptoms worsen or fail to improve - ondansetron (ZOFRAN ODT) 4 MG disintegrating tablet; Take 1 tablet (4 mg total) by mouth every 8 (eight) hours as needed for nausea or vomiting.  Dispense: 20 tablet; Refill: 0   No follow-ups on file.     I discussed the assessment and treatment plan with the patient. The patient was provided an opportunity to ask questions and all were answered. The patient agreed with the plan and demonstrated an understanding of the instructions.   The patient was advised to call back or seek an in-person evaluation if the symptoms worsen or if the condition fails to improve  as anticipated.  I provided 14 minutes of face-to-face time during this encounter via MyChart Video enabled encounter.   Reine Just [facility] [phone] (phone) [phone] (fax)  [facility]

## 2020-05-25 NOTE — Patient Instructions (Addendum)
Viral Gastroenteritis, Adult  Viral gastroenteritis is also known as the stomach flu. This condition may affect your stomach, small intestine, and large intestine. It can cause sudden watery diarrhea, fever, and vomiting. This condition is caused by many different viruses. These viruses can be passed from person to person very easily (are contagious). Diarrhea and vomiting can make you feel weak and cause you to become dehydrated. You may not be able to keep fluids down. Dehydration can make you tired and thirsty, cause you to have a dry mouth, and decrease how often you urinate. It is important to replace the fluids that you lose from diarrhea and vomiting. What are the causes? Gastroenteritis is caused by many viruses, including rotavirus and norovirus. Norovirus is the most common cause in adults. You can get sick after being exposed to the viruses from other people. You can also get sick by:  Eating food, drinking water, or touching a surface contaminated with one of these viruses.  Sharing utensils or other personal items with an infected person. What increases the risk? You are more likely to develop this condition if you:  Have a weak body defense system (immune system).  Live with one or more children who are younger than 2 years old.  Live in a nursing home.  Travel on cruise ships. What are the signs or symptoms? Symptoms of this condition start suddenly 1-3 days after exposure to a virus. Symptoms may last for a few days or for as long as a week. Common symptoms include watery diarrhea and vomiting. Other symptoms include:  Fever.  Headache.  Fatigue.  Pain in the abdomen.  Chills.  Weakness.  Nausea.  Muscle aches.  Loss of appetite. How is this diagnosed? This condition is diagnosed with a medical history and physical exam. You may also have a stool test to check for viruses or other infections. How is this treated? This condition typically goes away on its  own. The focus of treatment is to prevent dehydration and restore lost fluids (rehydration). This condition may be treated with:  An oral rehydration solution (ORS) to replace important salts and minerals (electrolytes) in your body. Take this if told by your health care provider. This is a drink that is sold at pharmacies and retail stores.  Medicines to help with your symptoms.  Probiotic supplements to reduce symptoms of diarrhea.  Fluids given through an IV, if dehydration is severe. Older adults and people with other diseases or a weak immune system are at higher risk for dehydration. Follow these instructions at home: Eating and drinking  Take an ORS as told by your health care provider.  Drink clear fluids in small amounts as you are able. Clear fluids include: ? Water. ? Ice chips. ? Diluted fruit juice. ? Low-calorie sports drinks.  Drink enough fluid to keep your urine pale yellow.  Eat small amounts of healthy foods every 3-4 hours as you are able. This may include whole grains, fruits, vegetables, lean meats, and yogurt.  Avoid fluids that contain a lot of sugar or caffeine, such as energy drinks, sports drinks, and soda.  Avoid spicy or fatty foods.  Avoid alcohol.   General instructions  Wash your hands often, especially after having diarrhea or vomiting. If soap and water are not available, use hand sanitizer.  Make sure that all people in your household wash their hands well and often.  Take over-the-counter and prescription medicines only as told by your health care provider.  Rest at   home while you recover.  Watch your condition for any changes.  Take a warm bath to relieve any burning or pain from frequent diarrhea episodes.  Keep all follow-up visits as told by your health care provider. This is important.   Contact a health care provider if you:  Cannot keep fluids down.  Have symptoms that get worse.  Have new symptoms.  Feel light-headed or  dizzy.  Have muscle cramps. Get help right away if you:  Have chest pain.  Feel extremely weak or you faint.  See blood in your vomit.  Have vomit that looks like coffee grounds.  Have bloody or black stools or stools that look like tar.  Have a severe headache, a stiff neck, or both.  Have a rash.  Have severe pain, cramping, or bloating in your abdomen.  Have trouble breathing or you are breathing very quickly.  Have a fast heartbeat.  Have skin that feels cold and clammy.  Feel confused.  Have pain when you urinate.  Have signs of dehydration, such as: ? Dark urine, very little urine, or no urine. ? Cracked lips. ? Dry mouth. ? Sunken eyes. ? Sleepiness. ? Weakness. Summary  Viral gastroenteritis is also known as the stomach flu. It can cause sudden watery diarrhea, fever, and vomiting.  This condition can be passed from person to person very easily (is contagious).  Take an ORS if told by your health care provider. This is a drink that is sold at pharmacies and retail stores.  Wash your hands often, especially after having diarrhea or vomiting. If soap and water are not available, use hand sanitizer. This information is not intended to replace advice given to you by your health care provider. Make sure you discuss any questions you have with your health care provider. Document Revised: 06/12/2018 Document Reviewed: 10/29/2017 Elsevier Patient Education  2021 Elsevier Inc.   Bland Diet A bland diet consists of foods that are often soft and do not have a lot of fat, fiber, or extra seasonings. Foods without fat, fiber, or seasoning are easier for the body to digest. They are also less likely to irritate your mouth, throat, stomach, and other parts of your digestive system. A bland diet is sometimes called a BRAT diet. What is my plan? Your health care provider or food and nutrition specialist (dietitian) may recommend specific changes to your diet to prevent  symptoms or to treat your symptoms. These changes may include:  Eating small meals often.  Cooking food until it is soft enough to chew easily.  Chewing your food well.  Drinking fluids slowly.  Not eating foods that are very spicy, sour, or fatty.  Not eating citrus fruits, such as oranges and grapefruit. What do I need to know about this diet?  Eat a variety of foods from the bland diet food list.  Do not follow a bland diet longer than needed.  Ask your health care provider whether you should take vitamins or supplements. What foods can I eat? Grains Hot cereals, such as cream of wheat. Rice. Bread, crackers, or tortillas made from refined white flour.   Vegetables Canned or cooked vegetables. Mashed or boiled potatoes. Fruits Bananas. Applesauce. Other types of cooked or canned fruit with the skin and seeds removed, such as canned peaches or pears.   Meats and other proteins Scrambled eggs. Creamy peanut butter or other nut butters. Lean, well-cooked meats, such as chicken or fish. Tofu. Soups or broths.   Dairy Low-fat   dairy products, such as milk, cottage cheese, or yogurt. Beverages Water. Herbal tea. Apple juice.   Fats and oils Mild salad dressings. Canola or olive oil. Sweets and desserts Pudding. Custard. Fruit gelatin. Ice cream. The items listed above may not be a complete list of recommended foods and beverages. Contact a dietitian for more options. What foods are not recommended? Grains Whole grain breads and cereals. Vegetables Raw vegetables. Fruits Raw fruits, especially citrus, berries, or dried fruits. Dairy Whole fat dairy foods. Beverages Caffeinated drinks. Alcohol. Seasonings and condiments Strongly flavored seasonings or condiments. Hot sauce. Salsa. Other foods Spicy foods. Fried foods. Sour foods, such as pickled or fermented foods. Foods with high sugar content. Foods high in fiber. The items listed above may not be a complete list of  foods and beverages to avoid. Contact a dietitian for more information. Summary  A bland diet consists of foods that are often soft and do not have a lot of fat, fiber, or extra seasonings.  Foods without fat, fiber, or seasoning are easier for the body to digest.  Check with your health care provider to see how long you should follow this diet plan. It is not meant to be followed for long periods. This information is not intended to replace advice given to you by your health care provider. Make sure you discuss any questions you have with your health care provider. Document Revised: 01/22/2017 Document Reviewed: 01/22/2017 Elsevier Patient Education  2021 Elsevier Inc.  

## 2020-10-01 ENCOUNTER — Telehealth: Payer: BC Managed Care – PPO | Admitting: Emergency Medicine

## 2020-10-01 DIAGNOSIS — J329 Chronic sinusitis, unspecified: Secondary | ICD-10-CM

## 2020-10-01 DIAGNOSIS — B9689 Other specified bacterial agents as the cause of diseases classified elsewhere: Secondary | ICD-10-CM

## 2020-10-01 MED ORDER — DOXYCYCLINE HYCLATE 100 MG PO TABS: 100.0000 mg | ORAL_TABLET | Freq: Two times a day (BID) | ORAL | 0 refills | Status: DC

## 2020-10-01 NOTE — Progress Notes (Signed)
E-Visit for Sinus Problems ° °We are sorry that you are not feeling well.  Here is how we plan to help! ° °Based on what you have shared with me it looks like you have sinusitis.  Sinusitis is inflammation and infection in the sinus cavities of the head.  Based on your presentation I believe you most likely have Acute Bacterial Sinusitis.  This is an infection caused by bacteria and is treated with antibiotics. I have prescribed Doxycycline 100mg by mouth twice a day for 10 days. You may use an oral decongestant such as Mucinex D or if you have glaucoma or high blood pressure use plain Mucinex. Saline nasal spray help and can safely be used as often as needed for congestion.  If you develop worsening sinus pain, fever or notice severe headache and vision changes, or if symptoms are not better after completion of antibiotic, please schedule an appointment with a health care provider.   ° °Sinus infections are not as easily transmitted as other respiratory infection, however we still recommend that you avoid close contact with loved ones, especially the very young and elderly.  Remember to wash your hands thoroughly throughout the day as this is the number one way to prevent the spread of infection! ° °Home Care: °Only take medications as instructed by your medical team. °Complete the entire course of an antibiotic. °Do not take these medications with alcohol. °A steam or ultrasonic humidifier can help congestion.  You can place a towel over your head and breathe in the steam from hot water coming from a faucet. °Avoid close contacts especially the very young and the elderly. °Cover your mouth when you cough or sneeze. °Always remember to wash your hands. ° °Get Help Right Away If: °You develop worsening fever or sinus pain. °You develop a severe head ache or visual changes. °Your symptoms persist after you have completed your treatment plan. ° °Make sure you °Understand these instructions. °Will watch your  condition. °Will get help right away if you are not doing well or get worse. ° °Thank you for choosing an e-visit. ° °Your e-visit answers were reviewed by a board certified advanced clinical practitioner to complete your personal care plan. Depending upon the condition, your plan could have included both over the counter or prescription medications. ° °Please review your pharmacy choice. Make sure the pharmacy is open so you can pick up prescription now. If there is a problem, you may contact your provider through MyChart messaging and have the prescription routed to another pharmacy.  Your safety is important to us. If you have drug allergies check your prescription carefully.  ° °For the next 24 hours you can use MyChart to ask questions about today's visit, request a non-urgent call back, or ask for a work or school excuse. °You will get an email in the next two days asking about your experience. I hope that your e-visit has been valuable and will speed your recovery. ° °I have spent 5 minutes in review of e-visit questionnaire, review and updating patient chart, medical decision making and response to patient.  ° °Tynesha Free, PhD, FNP-BC °  ° °

## 2020-10-17 ENCOUNTER — Telehealth: Payer: BC Managed Care – PPO | Admitting: Physician Assistant

## 2020-10-17 DIAGNOSIS — H6983 Other specified disorders of Eustachian tube, bilateral: Secondary | ICD-10-CM

## 2020-10-17 NOTE — Progress Notes (Signed)
I have spent 5 minutes in review of e-visit questionnaire, review and updating patient chart, medical decision making and response to patient.   Eyan Hagood Cody Bobbie Valletta, PA-C    

## 2020-10-17 NOTE — Progress Notes (Signed)
Hi Emily Sharp,  Based on what you have shared with me it seems you are dealing with some remaining Eustachian tube dysfunction after recent sinus infection. The fullness in the ear associated with popping/bubbling is concerning for this. Giving the pediatrician saw a clear ear drum, no sign of continued infection of the ears themselves which is good. I recommend you make sure youa re doing an antihistamine-decongestant combo like Claritin-D or Allegra-D to help open the tubes to regulate pressure and drain any fluid buildup. You should also continue use of a nasal steroid (Flonase or Nasacort) to help with this. It can take a few weeks after infection is gone to fully clear but these medications should help speed things along. If not fully resolving, I recommend an in-person evaluation.

## 2020-11-16 ENCOUNTER — Telehealth: Payer: BC Managed Care – PPO | Admitting: Physician Assistant

## 2020-11-16 DIAGNOSIS — J208 Acute bronchitis due to other specified organisms: Secondary | ICD-10-CM

## 2020-11-16 MED ORDER — BENZONATATE 100 MG PO CAPS: 100.0000 mg | ORAL_CAPSULE | Freq: Three times a day (TID) | ORAL | 0 refills | Status: DC | PRN

## 2020-11-16 MED ORDER — PREDNISONE 20 MG PO TABS: 40.0000 mg | ORAL_TABLET | Freq: Every day | ORAL | 0 refills | Status: DC

## 2020-11-16 MED ORDER — IPRATROPIUM BROMIDE 0.03 % NA SOLN: 2.0000 | Freq: Two times a day (BID) | NASAL | 0 refills | Status: DC

## 2020-11-16 NOTE — Progress Notes (Signed)
We are sorry that you are not feeling well.  Here is how we plan to help!  Based on your presentation I believe you most likely have A cough due to a virus.  This is called viral bronchitis and is best treated by rest, plenty of fluids and control of the cough.  You may use Ibuprofen or Tylenol as directed to help your symptoms.     In addition you may use A prescription cough medication called Tessalon Perles 100mg . You may take 1-2 capsules every 8 hours as needed for your cough.  Prednisone 20mg  Take 2 tablets with breakfast for 5 days  Ipratropium nasal spray 1 spray in each nostril every 12 hours for congestion and drainage.  From your responses in the eVisit questionnaire you describe inflammation in the upper respiratory tract which is causing a significant cough.  This is commonly called Bronchitis and has four common causes:   Allergies Viral Infections Acid Reflux Bacterial Infection Allergies, viruses and acid reflux are treated by controlling symptoms or eliminating the cause. An example might be a cough caused by taking certain blood pressure medications. You stop the cough by changing the medication. Another example might be a cough caused by acid reflux. Controlling the reflux helps control the cough.  USE OF BRONCHODILATOR ("RESCUE") INHALERS: There is a risk from using your bronchodilator too frequently.  The risk is that over-reliance on a medication which only relaxes the muscles surrounding the breathing tubes can reduce the effectiveness of medications prescribed to reduce swelling and congestion of the tubes themselves.  Although you feel brief relief from the bronchodilator inhaler, your asthma may actually be worsening with the tubes becoming more swollen and filled with mucus.  This can delay other crucial treatments, such as oral steroid medications. If you need to use a bronchodilator inhaler daily, several times per day, you should discuss this with your provider.  There  are probably better treatments that could be used to keep your asthma under control.     HOME CARE Only take medications as instructed by your medical team. Complete the entire course of an antibiotic. Drink plenty of fluids and get plenty of rest. Avoid close contacts especially the very young and the elderly Cover your mouth if you cough or cough into your sleeve. Always remember to wash your hands A steam or ultrasonic humidifier can help congestion.   GET HELP RIGHT AWAY IF: You develop worsening fever. You become short of breath You cough up blood. Your symptoms persist after you have completed your treatment plan MAKE SURE YOU  Understand these instructions. Will watch your condition. Will get help right away if you are not doing well or get worse.    Thank you for choosing an e-visit.  Your e-visit answers were reviewed by a board certified advanced clinical practitioner to complete your personal care plan. Depending upon the condition, your plan could have included both over the counter or prescription medications.  Please review your pharmacy choice. Make sure the pharmacy is open so you can pick up prescription now. If there is a problem, you may contact your provider through and have the prescription routed to another pharmacy.  Your safety is important to . If you have drug allergies check your prescription carefully.   For the next 24 hours you can use MyChart to ask questions about today's visit, request a non-urgent call back, or ask for a work or school excuse. You will get an email in  the next two days asking about your experience. I hope that your e-visit has been valuable and will speed your recovery.  I provided 5 minutes of non face-to-face time during this encounter for chart review and documentation.

## 2021-02-05 ENCOUNTER — Telehealth: Payer: BC Managed Care – PPO | Admitting: Emergency Medicine

## 2021-02-05 ENCOUNTER — Telehealth: Payer: BC Managed Care – PPO | Admitting: Nurse Practitioner

## 2021-02-05 DIAGNOSIS — J4521 Mild intermittent asthma with (acute) exacerbation: Secondary | ICD-10-CM | POA: Diagnosis not present

## 2021-02-05 DIAGNOSIS — R051 Acute cough: Secondary | ICD-10-CM | POA: Diagnosis not present

## 2021-02-05 MED ORDER — ALBUTEROL SULFATE HFA 108 (90 BASE) MCG/ACT IN AERS: 2.0000 | INHALATION_SPRAY | Freq: Four times a day (QID) | RESPIRATORY_TRACT | 0 refills | Status: DC | PRN

## 2021-02-05 MED ORDER — PREDNISONE 10 MG (21) PO TBPK: ORAL_TABLET | ORAL | 0 refills | Status: DC

## 2021-02-05 MED ORDER — BENZONATATE 100 MG PO CAPS: 100.0000 mg | ORAL_CAPSULE | Freq: Three times a day (TID) | ORAL | 0 refills | Status: DC | PRN

## 2021-02-05 NOTE — Progress Notes (Signed)
Virtual Visit Consent   Emily Sharp, you are scheduled for a virtual visit with a [facility] provider today.     Just as with appointments in the office, your consent must be obtained to participate.  Your consent will be active for this visit and any virtual visit you may have with one of our providers in the next 365 days.     If you have a MyChart account, a copy of this consent can be sent to you electronically.  All virtual visits are billed to your insurance company just like a traditional visit in the office.    As this is a virtual visit, video technology does not allow for your provider to perform a traditional examination.  This may limit your provider's ability to fully assess your condition.  If your provider identifies any concerns that need to be evaluated in person or the need to arrange testing (such as labs, EKG, etc.), we will make arrangements to do so.     Although advances in technology are sophisticated, we cannot ensure that it will always work on either your end or our end.  If the connection with a video visit is poor, the visit may have to be switched to a telephone visit.  With either a video or telephone visit, we are not always able to ensure that we have a secure connection.     I need to obtain your verbal consent now.   Are you willing to proceed with your visit today?    Emily Sharp has provided verbal consent on 02/05/2021 for a virtual visit (video or telephone).   Viviano Simas, FNP   Date: 02/05/2021 11:26 AM   Virtual Visit via Video Note   I, Viviano Simas, connected with  Emily Sharp  ([id number], 07-14-1980) on 02/05/21 at 11:30 AM EST by a video-enabled telemedicine application and verified that I am speaking with the correct person using two identifiers.  Location: Patient: Virtual Visit Location Patient: Home Provider: Virtual Visit Location Provider: Home Office   I discussed the limitations of evaluation and management by  telemedicine and the availability of in person appointments. The patient expressed understanding and agreed to proceed.    History of Present Illness: Emily Sharp is a 41 y.o. who identifies as a female who was assigned female at birth, and is being seen today with complaints of a cough. Her cough started 4 days ago, her cough is productive. She denies a fever. She denies any nasal congestion. Today the cough is worse and almost constant  She has a history of mild asthma she uses her inhaler seasonally.  Especially in the spring or with exposure to dust.   She is an Tourist information centre manager.   She has two children with Croup at the time.  She has taken a COVID test today that is negative.    Problems:  Patient Active Problem List   Diagnosis Date Noted   Multiple gestation 06/10/2017   PIH (pregnancy induced hypertension) 06/10/2017   Gestational diabetes 06/10/2017   H/O cesarean section 06/10/2017   Dichorionic diamniotic twin pregnancy, antepartum 05/06/2017   Abnormal glucose tolerance test (GTT) during pregnancy, antepartum 04/25/2017   Bradycardia 05/09/2016   Abnormal ECG 05/09/2016   Sleep apnea 05/09/2016    Allergies:  Allergies  Allergen Reactions   Ancef [Cefazolin] Rash and Cough   Medications:  Current Outpatient Medications  Medication Instructions   albuterol (PROVENTIL HFA;VENTOLIN HFA) 108 (90 Base) MCG/ACT inhaler 2 puffs, Inhalation, Every  4 hours PRN   albuterol (VENTOLIN HFA) 108 (90 Base) MCG/ACT inhaler 2 puffs, Inhalation, Every 6 hours PRN   benzonatate (TESSALON) 100 mg, Oral, 3 times daily PRN   fluticasone (FLONASE) 50 MCG/ACT nasal spray 1 spray, Nasal, Daily PRN   ipratropium (ATROVENT) 0.03 % nasal spray 2 sprays, Each Nare, Every 12 hours   predniSONE (STERAPRED UNI-PAK 21 TAB) 10 MG (21) TBPK tablet Take 6 tablets on day one, 5 on day two, 4 on day three, 3 on day four, 2 on day five, and 1 on day six. Take with food.   Sertraline  HCl (ZOLOFT PO) No dose, route, or frequency recorded.     Observations/Objective: Patient is well-developed, well-nourished in no acute distress.  Resting comfortably at home.  Head is normocephalic, atraumatic.  No labored breathing.  Speech is clear and coherent with logical content.  Patient is alert and oriented at baseline.  Dry constant cough no acute distress  1. Acute cough/Viral  - predniSONE (STERAPRED UNI-PAK 21 TAB) 10 MG (21) TBPK tablet; Take 6 tablets on day one, 5 on day two, 4 on day three, 3 on day four, 2 on day five, and 1 on day six. Take with food.  Dispense: 21 tablet; Refill: 0 - benzonatate (TESSALON) 100 MG capsule; Take 1 capsule (100 mg total) by mouth 3 (three) times daily as needed for cough.  Dispense: 30 capsule; Refill: 0  2. Mild intermittent asthma with acute exacerbation  - albuterol (VENTOLIN HFA) 108 (90 Base) MCG/ACT inhaler; Inhale 2 puffs into the lungs every 6 (six) hours as needed for wheezing or shortness of breath.  Dispense: 8 g; Refill: 0    Follow Up Instructions: I discussed the assessment and treatment plan with the patient. The patient was provided an opportunity to ask questions and all were answered. The patient agreed with the plan and demonstrated an understanding of the instructions.  A copy of instructions were sent to the patient via MyChart unless otherwise noted below.   The patient was advised to call back or seek an in-person evaluation if the symptoms worsen or if the condition fails to improve as anticipated.  Time:  I spent 10 minutes with the patient via telehealth technology discussing the above problems/concerns.    Viviano Simas, FNP

## 2021-02-05 NOTE — Progress Notes (Signed)
Not enough info in questionnaire to determine what will best help patient. I recommended video visit

## 2021-02-12 ENCOUNTER — Telehealth: Payer: BC Managed Care – PPO | Admitting: Physician Assistant

## 2021-02-12 DIAGNOSIS — J019 Acute sinusitis, unspecified: Secondary | ICD-10-CM | POA: Diagnosis not present

## 2021-02-12 DIAGNOSIS — B9689 Other specified bacterial agents as the cause of diseases classified elsewhere: Secondary | ICD-10-CM

## 2021-02-12 MED ORDER — DOXYCYCLINE HYCLATE 100 MG PO TABS: 100.0000 mg | ORAL_TABLET | Freq: Two times a day (BID) | ORAL | 0 refills | Status: DC

## 2021-02-12 NOTE — Progress Notes (Signed)
I have spent 5 minutes in review of e-visit questionnaire, review and updating patient chart, medical decision making and response to patient.   Caster Fayette Cody Shantai Tiedeman, PA-C    

## 2021-02-12 NOTE — Progress Notes (Signed)

## 2021-04-20 ENCOUNTER — Telehealth: Payer: BC Managed Care – PPO | Admitting: Physician Assistant

## 2021-04-20 DIAGNOSIS — J4551 Severe persistent asthma with (acute) exacerbation: Secondary | ICD-10-CM

## 2021-04-20 MED ORDER — PREDNISONE 20 MG PO TABS: 40.0000 mg | ORAL_TABLET | Freq: Every day | ORAL | 0 refills | Status: DC

## 2021-04-20 NOTE — Progress Notes (Signed)
Visit for Asthma  Based on what you have shared with me, it looks like you may have a flare up of your asthma.  Asthma is a chronic (ongoing) lung disease which results in airway obstruction, inflammation and hyper-responsiveness.   Asthma symptoms vary from person to person, with common symptoms including nighttime awakening and decreased ability to participate in normal activities as a result of shortness of breath. It is often triggered by changes in weather, changes in the season, changes in air temperature, or inside (home, school, daycare or work) allergens such as animal dander, mold, mildew, woodstoves or cockroaches.   It can also be triggered by hormonal changes, extreme emotion, physical exertion or an upper respiratory tract illness.     It is important to identify the trigger, and then eliminate or avoid the trigger if possible.   If you have been prescribed medications to be taken on a regular basis, it is important to follow the asthma action plan and to follow guidelines to adjust medication in response to increasing symptoms of decreased peak expiratory flow rate  Treatment: I have prescribed: Prednisone 40mg by mouth per day for 5 - 7 days  HOME CARE Only take medications as instructed by your medical team. Consider wearing a mask or scarf to improve breathing air temperature have been shown to decrease irritation and decrease exacerbations Get rest. Taking a steamy shower or using a humidifier may help nasal congestion sand ease sore throat pain. You can place a towel over your head and breathe in the steam from hot water coming from a faucet. Using a saline nasal spray works much the same way.  Cough drops, hare candies and sore throat lozenges may ease your cough.  Avoid close contacts especially the very you and the elderly Cover your mouth if you cough or  sneeze Always remember to wash your hands.    GET HELP RIGHT AWAY IF: You develop worsening symptoms; breathlessness at rest, drowsy, confused or agitated, unable to speak in full sentences You have coughing fits You develop a severe headache or visual changes You develop shortness of breath, difficulty breathing or start having chest pain Your symptoms persist after you have completed your treatment plan If your symptoms do not improve within 10 days  MAKE SURE YOU Understand these instructions. Will watch your condition. Will get help right away if you are not doing well or get worse.   Your e-visit answers were reviewed by a board certified advanced clinical practitioner to complete your personal care plan, Depending upon the condition, your plan could have included both over the counter or prescription medications.   Please review your pharmacy choice. Your safety is important to us. If you have drug allergies check your prescription carefully.  You can use MyChart to ask questions about today's visit, request a non-urgent  call back, or ask for a work or school excuse for 24 hours related to this e-Visit. If it has been greater than 24 hours you will need to follow up with your provider, or enter a new e-Visit to address those concerns.   You will get an e-mail in the next two days asking about your experience. I hope that your e-visit has been valuable and will speed your recovery. Thank you for using e-visits.  I provided 5 minutes of non face-to-face time during this encounter for chart review and documentation.   

## 2021-04-28 ENCOUNTER — Telehealth: Payer: BC Managed Care – PPO | Admitting: Nurse Practitioner

## 2021-04-28 DIAGNOSIS — J019 Acute sinusitis, unspecified: Secondary | ICD-10-CM | POA: Diagnosis not present

## 2021-04-28 DIAGNOSIS — B9689 Other specified bacterial agents as the cause of diseases classified elsewhere: Secondary | ICD-10-CM | POA: Diagnosis not present

## 2021-04-28 MED ORDER — DOXYCYCLINE HYCLATE 100 MG PO TABS: 100.0000 mg | ORAL_TABLET | Freq: Two times a day (BID) | ORAL | 0 refills | Status: DC

## 2021-04-28 NOTE — Progress Notes (Signed)
E-Visit for Sinus Problems ? ?We are sorry that you are not feeling well.  Here is how we plan to help! ? ?Based on what you have shared with me it looks like you have sinusitis.  Sinusitis is inflammation and infection in the sinus cavities of the head.  Based on your presentation I believe you most likely have Acute Bacterial Sinusitis.  This is an infection caused by bacteria and is treated with antibiotics. I have prescribed Doxycycline 100mg  by mouth twice a day for 10 days. You may use an oral decongestant such as Mucinex D or if you have glaucoma or high blood pressure use plain Mucinex. Saline nasal spray help and can safely be used as often as needed for congestion.  If you develop worsening sinus pain, fever or notice severe headache and vision changes, or if symptoms are not better after completion of antibiotic, please schedule an appointment with a health care provider.   ? ?KEEP IN MIND 3 SINUS INFECTIONS WITHIN WARRANTS A REFERRAL TO ENT AND AN IN OFFICE VISIT WITH YOUR PCP. As of now this is your second sinus infection in 2 months.  ? ? ?Sinus infections are not as easily transmitted as other respiratory infection, however we still recommend that you avoid close contact with loved ones, especially the very young and elderly.  Remember to wash your hands thoroughly throughout the day as this is the number one way to prevent the spread of infection! ? ?Home Care: ?Only take medications as instructed by your medical team. ?Complete the entire course of an antibiotic. ?Do not take these medications with alcohol. ?A steam or ultrasonic humidifier can help congestion.  You can place a towel over your head and breathe in the steam from hot water coming from a faucet. ?Avoid close contacts especially the very young and the elderly. ?Cover your mouth when you cough or sneeze. ?Always remember to wash your hands. ? ?Get Help Right Away If: ?You develop worsening fever or sinus pain. ?You develop a severe  head ache or visual changes. ?Your symptoms persist after you have completed your treatment plan. ? ?Make sure you ?Understand these instructions. ?Will watch your condition. ?Will get help right away if you are not doing well or get worse. ? ?Thank you for choosing an e-visit. ? ?Your e-visit answers were reviewed by a board certified advanced clinical practitioner to complete your personal care plan. Depending upon the condition, your plan could have included both over the counter or prescription medications. ? ?Please review your pharmacy choice. Make sure the pharmacy is open so you can pick up prescription now. If there is a problem, you may contact your provider through and have the prescription routed to another pharmacy.  Your safety is important to [employer]. If you have drug allergies check your prescription carefully.  ? ?For the next 24 hours you can use MyChart to ask questions about today's visit, request a non-urgent call back, or ask for a work or school excuse. ?You will get an email in the next two days asking about your experience. I hope that your e-visit has been valuable and will speed your recovery.  ?

## 2021-04-28 NOTE — Progress Notes (Signed)
I have spent 5 minutes in review of e-visit questionnaire, review and updating patient chart, medical decision making and response to patient.  ° °Emily Sharp W Emily Fiorenza, NP ° °  °

## 2021-05-20 ENCOUNTER — Telehealth: Payer: BC Managed Care – PPO | Admitting: Family

## 2021-05-20 DIAGNOSIS — R059 Cough, unspecified: Secondary | ICD-10-CM

## 2021-05-20 DIAGNOSIS — R509 Fever, unspecified: Secondary | ICD-10-CM

## 2021-05-20 NOTE — Progress Notes (Signed)
Based on what you shared with me, I feel your condition warrants further evaluation and I recommend that you be seen in a face to face visit. ? ?Given your symptoms and high fevers and you recently just took antibiotics, you need to be seen face to face to rule out a more serious infection.  ?  ?NOTE: There will be NO CHARGE for this eVisit ?  ?If you are having a true medical emergency please call 911.   ?  ? For an urgent face to face visit, [place] has six urgent care centers for your convenience:  ?  ? [facility] ?Get Driving Directions ?[phone] ?[phone] ?[place], [place] [address] ?  ? [facility]) ?Get Driving Directions ?[phone] ?[address] ?[place], [place] [address] ? ?[facility]) ?Get Driving Directions ?[phone] ?[address] ?[place],  [place]  [address] ? ?[facility] ?Get Driving Directions ?[phone] ?[address], [address] ?[place], [place] [address] ?  ?[facility] [place] ?Get Driving Directions  ?[phone] ?[address] ?[address] ?[place], [place] [address] ?  ?[facility] ?Get Driving Directions ?[phone] ?[address] ?[place], [place] [address] ? ?Your MyChart E-visit questionnaire answers were reviewed by a board certified advanced clinical practitioner to complete your personal care plan based on your specific symptoms.  Thank you for using e-Visits. ?  ? ?

## 2021-05-22 ENCOUNTER — Encounter (HOSPITAL_BASED_OUTPATIENT_CLINIC_OR_DEPARTMENT_OTHER): Payer: Self-pay

## 2021-05-22 ENCOUNTER — Other Ambulatory Visit: Payer: Self-pay

## 2021-05-22 ENCOUNTER — Emergency Department (HOSPITAL_BASED_OUTPATIENT_CLINIC_OR_DEPARTMENT_OTHER): Payer: BC Managed Care – PPO

## 2021-05-22 ENCOUNTER — Emergency Department (HOSPITAL_BASED_OUTPATIENT_CLINIC_OR_DEPARTMENT_OTHER)
Admission: EM | Admit: 2021-05-22 | Discharge: 2021-05-22 | Disposition: A | Discharge status: Home / Self Care | Payer: BC Managed Care – PPO | Attending: Emergency Medicine | Admitting: Emergency Medicine

## 2021-05-22 DIAGNOSIS — R059 Cough, unspecified: Secondary | ICD-10-CM | POA: Insufficient documentation

## 2021-05-22 DIAGNOSIS — R110 Nausea: Secondary | ICD-10-CM | POA: Diagnosis not present

## 2021-05-22 DIAGNOSIS — R000 Tachycardia, unspecified: Secondary | ICD-10-CM | POA: Insufficient documentation

## 2021-05-22 DIAGNOSIS — M791 Myalgia, unspecified site: Secondary | ICD-10-CM | POA: Insufficient documentation

## 2021-05-22 DIAGNOSIS — Z20822 Contact with and (suspected) exposure to covid-19: Secondary | ICD-10-CM | POA: Insufficient documentation

## 2021-05-22 DIAGNOSIS — R5383 Other fatigue: Secondary | ICD-10-CM | POA: Diagnosis not present

## 2021-05-22 DIAGNOSIS — R197 Diarrhea, unspecified: Secondary | ICD-10-CM | POA: Diagnosis not present

## 2021-05-22 DIAGNOSIS — R509 Fever, unspecified: Secondary | ICD-10-CM | POA: Diagnosis present

## 2021-05-22 LAB — RESP PANEL BY RT-PCR (FLU A&B, COVID) ARPGX2
Influenza A by PCR: NEGATIVE
Influenza B by PCR: NEGATIVE
SARS Coronavirus 2 by RT PCR: NEGATIVE

## 2021-05-22 LAB — URINALYSIS, ROUTINE W REFLEX MICROSCOPIC
Bilirubin Urine: NEGATIVE
Glucose, UA: NEGATIVE mg/dL
Hgb urine dipstick: NEGATIVE
Ketones, ur: NEGATIVE mg/dL
Leukocytes,Ua: NEGATIVE
Nitrite: NEGATIVE
Protein, ur: 30 mg/dL — AB
Specific Gravity, Urine: 1.027 (ref 1.005–1.030)
pH: 5.5 (ref 5.0–8.0)

## 2021-05-22 LAB — COMPREHENSIVE METABOLIC PANEL
ALT: 11 U/L (ref 0–44)
AST: 19 U/L (ref 15–41)
Albumin: 3.8 g/dL (ref 3.5–5.0)
Alkaline Phosphatase: 67 U/L (ref 38–126)
Anion gap: 14 (ref 5–15)
BUN: 11 mg/dL (ref 6–20)
CO2: 20 mmol/L — ABNORMAL LOW (ref 22–32)
Calcium: 9 mg/dL (ref 8.9–10.3)
Chloride: 100 mmol/L (ref 98–111)
Creatinine, Ser: 0.82 mg/dL (ref 0.44–1.00)
GFR, Estimated: >60 mL/min (ref >60)
Glucose, Bld: 103 mg/dL — ABNORMAL HIGH (ref 70–99)
Potassium: 4 mmol/L (ref 3.5–5.1)
Sodium: 134 mmol/L — ABNORMAL LOW (ref 135–145)
Total Bilirubin: 0.4 mg/dL (ref 0.3–1.2)
Total Protein: 7.5 g/dL (ref 6.5–8.1)

## 2021-05-22 LAB — CBC
HCT: 40.8 % (ref 36.0–46.0)
Hemoglobin: 13.4 g/dL (ref 12.0–15.0)
MCH: 28.4 pg (ref 26.0–34.0)
MCHC: 32.8 g/dL (ref 30.0–36.0)
MCV: 86.4 fL (ref 80.0–100.0)
Platelets: 199 10*3/uL (ref 150–400)
RBC: 4.72 MIL/uL (ref 3.87–5.11)
RDW: 13.7 % (ref 11.5–15.5)
WBC: 8.1 10*3/uL (ref 4.0–10.5)
nRBC: 0 % (ref 0.0–0.2)

## 2021-05-22 LAB — LACTIC ACID, PLASMA: Lactic Acid, Venous: 1.3 mmol/L (ref 0.5–1.9)

## 2021-05-22 LAB — HCG, QUANTITATIVE, PREGNANCY: hCG, Beta Chain, Quant, S: <1 m[IU]/mL (ref <5)

## 2021-05-22 MED ORDER — ACETAMINOPHEN 325 MG PO TABS
650.0000 mg | ORAL_TABLET | Freq: Once | ORAL | Status: AC
  Administered 2021-05-22: 650 mg via ORAL
  Filled 2021-05-22: qty 2

## 2021-05-22 MED ORDER — ALBUTEROL SULFATE HFA 108 (90 BASE) MCG/ACT IN AERS
1.0000 | INHALATION_SPRAY | RESPIRATORY_TRACT | Status: DC
  Administered 2021-05-22: 2 via RESPIRATORY_TRACT
  Filled 2021-05-22: qty 6.7

## 2021-05-22 MED ORDER — ONDANSETRON HCL 4 MG PO TABS
4.0000 mg | ORAL_TABLET | Freq: Three times a day (TID) | ORAL | 0 refills | Status: DC | PRN
  Filled 2021-05-22: qty 4, 2d supply, fill #0

## 2021-05-22 MED ORDER — LACTATED RINGERS IV BOLUS
1000.0000 mL | Freq: Once | INTRAVENOUS | Status: AC
  Administered 2021-05-22: 1000 mL via INTRAVENOUS

## 2021-05-22 MED ORDER — ONDANSETRON HCL 4 MG PO TABS: 4.0000 mg | ORAL_TABLET | Freq: Three times a day (TID) | ORAL | 0 refills | Status: DC | PRN

## 2021-05-22 NOTE — Discharge Instructions (Addendum)
Please drink plenty of fluids. Take nausea medicine as needed. ?Alternate acetaminophen and ibuprofen for fever control. ?Return if you are having worsening symptoms or cannot tolerate liquids. ?

## 2021-05-22 NOTE — ED Triage Notes (Signed)
Pt c/o fever, cough, chills, nausea, diarrhea since Friday. Pt last dose of tylenol/motrin at 0530 this morning.  ?

## 2021-05-22 NOTE — ED Notes (Signed)
Patient given discharge instructions. Questions were answered. Patient verbalized understanding of discharge instructions and care at home.  Discharged with spouse  

## 2021-05-22 NOTE — ED Provider Notes (Signed)
?[facility] DEPT ?Provider Note ? ? ?CSN: [id number] ?Arrival date & time: 05/22/21  1303 ? ?  ? ?History ? ?Chief Complaint  ?Patient presents with  ?? Fever  ?? Cough  ? ? ?Emily Sharp is a 41 y.o. female. ? ?HPI ?41 year old female presents today complaining of fever,, cough, body aches, with some associated nausea and some loose stool today.  Symptoms began on Thursday.  She last took Tylenol and ibuprofen this morning. ? ?  ? ?Home Medications ?Prior to Admission medications   ?Medication Sig Start Date End Date Taking? Authorizing Provider  ?albuterol (PROVENTIL HFA;VENTOLIN HFA) 108 (90 Base) MCG/ACT inhaler Inhale 2 puffs into the lungs every 4 (four) hours as needed for wheezing or shortness of breath.    [provider]  ?albuterol (VENTOLIN HFA) 108 (90 Base) MCG/ACT inhaler Inhale 2 puffs into the lungs every 6 (six) hours as needed for wheezing or shortness of breath. 02/05/21   Viviano Simas, FNP  ?benzonatate (TESSALON) 100 MG capsule Take 1 capsule (100 mg total) by mouth 3 (three) times daily as needed for cough. 02/05/21   Viviano Simas, FNP  ?fluticasone (FLONASE) 50 MCG/ACT nasal spray Place 1 spray into the nose daily as needed for allergies.     [provider]  ?ipratropium (ATROVENT) 0.03 % nasal spray Place 2 sprays into both nostrils every 12 (twelve) hours. 11/16/20   Margaretann Loveless, PA-C  ?predniSONE (DELTASONE) 20 MG tablet Take 2 tablets (40 mg total) by mouth daily with breakfast. 04/20/21   Margaretann Loveless, PA-C  ?Sertraline HCl (ZOLOFT PO)     [provider]  ?   ? ?Allergies    ?Sulfa antibiotics and Ancef [cefazolin]   ? ?Review of Systems   ?Review of Systems ? ?Physical Exam ?Updated Vital Signs ?BP 123/71   Pulse (!) 112   Temp (!) 102.8 ?F (39.3 ?C) (Oral)   Resp (!) 21   Ht 1.727 m (5\' 8" )   Wt 122.5 kg   LMP  (Within Weeks)   SpO2 95%   BMI 41.05 kg/m?  ?Physical Exam ?Vitals and nursing note reviewed.   ?Constitutional:   ?   General: She is not in acute distress. ?   Appearance: Normal appearance. She is obese. She is ill-appearing.  ?HENT:  ?   Head: Normocephalic.  ?   Right Ear: External ear normal.  ?   Left Ear: External ear normal.  ?   Nose: Nose normal.  ?   Mouth/Throat:  ?   Mouth: Mucous membranes are moist.  ?   Pharynx: Oropharynx is clear.  ?Eyes:  ?   Extraocular Movements: Extraocular movements intact.  ?   Pupils: Pupils are equal, round, and reactive to light.  ?Cardiovascular:  ?   Rate and Rhythm: Regular rhythm. Tachycardia present.  ?   Pulses: Normal pulses.  ?   Heart sounds: Normal heart sounds.  ?Pulmonary:  ?   Effort: Pulmonary effort is normal.  ?   Breath sounds: Normal breath sounds.  ?Abdominal:  ?   General: Bowel sounds are normal.  ?   Palpations: Abdomen is soft.  ?   Tenderness: There is no abdominal tenderness.  ?Musculoskeletal:     ?   General: No swelling or tenderness. Normal range of motion.  ?   Cervical back: Normal range of motion.  ?Skin: ?   General: Skin is warm and dry.  ?   Findings: No erythema or rash.  ?  Neurological:  ?   General: No focal deficit present.  ?   Mental Status: She is alert.  ?Psychiatric:     ?   Mood and Affect: Mood normal.     ?   Behavior: Behavior normal.  ? ? ?ED Results / Procedures / Treatments   ?Labs ?(all labs ordered are listed, but only abnormal results are displayed) ?Labs Reviewed  ?COMPREHENSIVE METABOLIC PANEL - Abnormal; Notable for the following components:  ?    Result Value  ? Sodium 134 (*)   ? CO2 20 (*)   ? Glucose, Bld 103 (*)   ? All other components within normal limits  ?URINALYSIS, ROUTINE W REFLEX MICROSCOPIC - Abnormal; Notable for the following components:  ? APPearance HAZY (*)   ? Protein, ur 30 (*)   ? All other components within normal limits  ?RESP PANEL BY RT-PCR (FLU A&B, COVID) ARPGX2  ?CBC  ?LACTIC ACID, PLASMA  ?HCG, QUANTITATIVE, PREGNANCY  ?LACTIC ACID, PLASMA  ? ? ?EKG ?None ? ?Radiology ?DG Chest  Port 1 View ? ?Result Date: 05/22/2021 ?CLINICAL DATA:  Fever and cough with congestion and nausea for 5 days in a 41 year old female. EXAM: PORTABLE CHEST 1 VIEW COMPARISON:  June 17, 2016. FINDINGS: EKG leads project over the chest. Image rotated slightly to the LEFT. Accounting for this there is perhaps mild cardiac enlargement on AP portable radiograph. Hilar structures are stable accounting for rotation. No lobar consolidation. No sign of pneumothorax. On limited assessment there is no acute skeletal finding. IMPRESSION: 1. No active cardiopulmonary disease. 2. Top-normal heart size. Electronically Signed   By: Donzetta Kohut M.D.   On: 05/22/2021 14:12   ? ?Procedures ?Procedures  ? ? ?Medications Ordered in ED ?Medications  ?lactated ringers bolus 1,000 mL (has no administration in time range)  ?acetaminophen (TYLENOL) tablet 650 mg (650 mg Oral Given 05/22/21 1404)  ?lactated ringers bolus 1,000 mL (0 mLs Intravenous Stopped 05/22/21 1536)  ? ? ?ED Course/ Medical Decision Making/ A&P ?Clinical Course as of 05/22/21 1557  ?Tue May 22, 2021  ?1445 Chest x-Marky Buresh reviewed interpreted and no evidence of acute focal consolidation and radiologist interpretation concurs [DR]  ?1556 CBC reviewed interpreted and normal ?Pregnancy test is negative ?Mild hyponatremia at 134 and CO2 decreased at 20, however unchanged from first prior [DR]  ?1557 Resp Panel by RT-PCR (Flu A&B, Covid) Nasopharyngeal Swab ?COVID and influenza swabs reviewed and interpreted and are negative [DR]  ?  ?Clinical Course User Index ?[DR] Margarita Grizzle, MD  ? ?                        ?Medical Decision Making ?41 year old female presents today with fever, body aches, cough, generalized malaise, some nausea and loose stools. ?She is tachycardic on presentation with temp of 100.5 ?Patient is evaluated here for sepsis, viral and bacterial infections including pneumonia, COVID, flu, UTI, and other acute infectious etiologies ?Patient is likely somewhat  volume depleted and is being given IV fluids ?Reviewed labs including CBC, chemistry, lactic acid, COVID, flu, chest x-Joseh Sjogren and no definitive etiology of fever noted ?Patient feels greatly improved after first liter of fluid.  However, she continues tachycardic to 115. ?Plan repeat liter of normal saline and likely discharge ?Care discussed with Dr. Coralee Pesa and she will discharge if appropriate ? ? ?Amount and/or Complexity of Data Reviewed ?Labs: ordered. Decision-making details documented in ED Course. ?Radiology: ordered and independent interpretation performed. Decision-making details documented  in ED Course. ? ?Risk ?OTC drugs. ?Decision regarding hospitalization. ? ? ? ? ? ? ? ? ? ? ?Final Clinical Impression(s) / ED Diagnoses ?Final diagnoses:  ?Febrile illness, acute  ? ? ?Rx / DC Orders ?ED Discharge Orders   ? ? None  ? ?  ? ? ?  ?Margarita Grizzleay, Jorgia Manthei, MD ?05/22/21 1556 ? ?

## 2021-05-22 NOTE — ED Notes (Signed)
RT note: Pt. educated on Albuterol Inhaler use/Indications, able to use independantly with great effort. ?

## 2021-05-22 NOTE — ED Provider Notes (Signed)
Patient signed out to me by previous provider. Please refer to their note for full HPI.  Briefly this is a 41 year old female who presented with generalized illness.  Was febrile and tachycardic on arrival.  Work-up is reassuring from an infection/sepsis standpoint.  After IV fluids and medication her symptoms have significantly improved.  Still tachycardic at time of signout.  Plan for continued IV hydration and reevaluation. ? ?On reevaluation patient has almost completed her second liter of fluids.  She is able to drink water with no difficulty.  Heart rate has reduced to 105, vitals otherwise remained stable, no new symptoms.  She is fatigued appearing but nontoxic.  Plan for outpatient treatment/oral rehydration.  Patient at this time appears safe and stable for discharge and close outpatient follow up. Discharge plan and strict return to ED precautions discussed, patient verbalizes understanding and agreement. ?  Lorelle Gibbs, DO ?05/22/21 1839 ? ?

## 2021-05-23 ENCOUNTER — Other Ambulatory Visit (HOSPITAL_BASED_OUTPATIENT_CLINIC_OR_DEPARTMENT_OTHER): Payer: Self-pay

## 2021-05-25 ENCOUNTER — Emergency Department (HOSPITAL_BASED_OUTPATIENT_CLINIC_OR_DEPARTMENT_OTHER): Admission: EM | Admit: 2021-05-25 | Payer: BC Managed Care – PPO | Source: Home / Self Care

## 2021-05-25 ENCOUNTER — Ambulatory Visit: Payer: Self-pay

## 2021-05-25 ENCOUNTER — Ambulatory Visit
Admission: RE | Admit: 2021-05-25 | Discharge: 2021-05-25 | Disposition: A | Discharge status: Home / Self Care | Payer: BC Managed Care – PPO | Source: Ambulatory Visit | Attending: Emergency Medicine | Admitting: Emergency Medicine

## 2021-05-25 ENCOUNTER — Telehealth: Payer: Self-pay | Admitting: Emergency Medicine

## 2021-05-25 ENCOUNTER — Other Ambulatory Visit: Payer: Self-pay

## 2021-05-25 ENCOUNTER — Ambulatory Visit (HOSPITAL_BASED_OUTPATIENT_CLINIC_OR_DEPARTMENT_OTHER)
Admission: RE | Admit: 2021-05-25 | Discharge: 2021-05-25 | Disposition: A | Discharge status: Home / Self Care | Payer: BC Managed Care – PPO | Source: Ambulatory Visit | Attending: Emergency Medicine | Admitting: Emergency Medicine

## 2021-05-25 VITALS — BP 106/72 | HR 93 | Temp 98.4°F | Resp 20

## 2021-05-25 DIAGNOSIS — J310 Chronic rhinitis: Secondary | ICD-10-CM

## 2021-05-25 DIAGNOSIS — J3089 Other allergic rhinitis: Secondary | ICD-10-CM

## 2021-05-25 DIAGNOSIS — H66001 Acute suppurative otitis media without spontaneous rupture of ear drum, right ear: Secondary | ICD-10-CM

## 2021-05-25 DIAGNOSIS — J329 Chronic sinusitis, unspecified: Secondary | ICD-10-CM

## 2021-05-25 DIAGNOSIS — J90 Pleural effusion, not elsewhere classified: Secondary | ICD-10-CM | POA: Insufficient documentation

## 2021-05-25 DIAGNOSIS — J988 Other specified respiratory disorders: Secondary | ICD-10-CM | POA: Diagnosis not present

## 2021-05-25 DIAGNOSIS — J302 Other seasonal allergic rhinitis: Secondary | ICD-10-CM

## 2021-05-25 MED ORDER — AMOXICILLIN-POT CLAVULANATE 875-125 MG PO TABS: 1.0000 | ORAL_TABLET | Freq: Two times a day (BID) | ORAL | 0 refills | Status: AC

## 2021-05-25 MED ORDER — FLUTICASONE PROPIONATE 50 MCG/ACT NA SUSP: 1.0000 | Freq: Every day | NASAL | 1 refills | Status: AC

## 2021-05-25 MED ORDER — IPRATROPIUM BROMIDE 0.06 % NA SOLN: 2.0000 | Freq: Three times a day (TID) | NASAL | 1 refills | Status: DC

## 2021-05-25 MED ORDER — DOXYCYCLINE MONOHYDRATE 100 MG PO TABS: 100.0000 mg | ORAL_TABLET | Freq: Two times a day (BID) | ORAL | 0 refills | Status: AC

## 2021-05-25 MED ORDER — FEXOFENADINE HCL 180 MG PO TABS: 180.0000 mg | ORAL_TABLET | Freq: Every day | ORAL | 1 refills | Status: DC

## 2021-05-25 MED ORDER — MONTELUKAST SODIUM 10 MG PO TABS: 10.0000 mg | ORAL_TABLET | Freq: Every day | ORAL | 2 refills | Status: DC

## 2021-05-25 MED ORDER — ALBUTEROL SULFATE HFA 108 (90 BASE) MCG/ACT IN AERS: 2.0000 | INHALATION_SPRAY | Freq: Four times a day (QID) | RESPIRATORY_TRACT | 0 refills | Status: DC | PRN

## 2021-05-25 NOTE — Discharge Instructions (Signed)
Your symptoms and physical exam findings are concerning for a viral respiratory infection and uncontrolled seasonal allergies that if not only flared up your asthma symptoms but have also caused you to develop a bacterial infection in your right inner ear.    As we discussed, please go to the [facility] now for chest x-ray to evaluate you for possible pneumonia.  Your breath sounds are rough at this time and I think it is important to rule pneumonia out.    Please see the list below for recommended medications, dosages and frequencies to provide relief of your current symptoms:    Doxycycline: Please take 1 tablet twice daily for the next 10 days to resolve the infection that is in your right ear that is likely related to some bacterial infection in your sinuses and may be contributing to your cough.  Doxycycline has been prescribed to you in the past and since you do not recall any allergic reactions to biotics in the past few years I believe this will be safe for you to take.  Please keep in mind the doxycycline should not be taken at the same time as any dairy products because it binds with calcium and makes it inert.  Please also avoid direct exposure to the summer you are taking doxycycline, it can make your skin more sensitive to harmful UV rays.  It is very important that you take antibiotics as prescribed.  If you skip doses or do not complete the full course of antibiotics, you put yourself at significant risk of recurrent infection which can often be worse than your initial infection.   Allegra (fexofenadine): This is an excellent second-generation antihistamine that helps to reduce respiratory inflammatory response to environmental allergens.  This medication is not known to cause daytime sleepiness so it can be taken in the daytime.  If you find that it does make you sleepy, please feel free to take it at bedtime.  I have provided you with a new  prescription for this medication   Singulair (montelukast): This is a mast cell stabilizer that works well with antihistamines.  Mast cells are responsible for stimulating histamine production so you can imagine that if we can reduce the activity of your mast cells, then fewer histamines will be produced and inflammation caused by allergy exposure will be significantly reduced.  I recommend that you take this medication at the same time you take your antihistamine.  I recommend that you continue this medication for the next 30 days and longer if you feel that it is very helpful.   Flonase (fluticasone): This is a steroid nasal spray that you use once daily, 1 spray in each nare.  This medication does not work well if you decide to use it only used as you feel you need to, it works best used on a daily basis.  After 3 to 5 days of use, you will notice significant reduction of the inflammation and mucus production that is currently being caused by exposure to allergens, whether seasonal or environmental.  The most common side effect of this medication is nosebleeds.  If you experience a nosebleed, please discontinue use for 1 week, then feel free to resume.  I have provided you with a new prescription.     Atrovent (ipratropium): This is an excellent nasal decongestant spray I have added to your recommended nasal steroid that will not cause rebound congestion, please instill 2 sprays into each nare with each use.  Because nasal steroids can take several days before they begin to provide full benefit, I recommend that you use this spray in addition to the nasal steroid prescribed for you.  Please use it after you have used your nasal steroid and repeat up to 4 times daily as needed.  I have provided you with a new prescription for this medication.      ProAir, Ventolin, Proventil (albuterol): This inhaled medication contains a short acting beta agonist bronchodilator, this is the inhaler that was provided to  you in the emergency room.  This medication works on the smooth muscle that opens and constricts of your airways by relaxing the muscle.  The result of relaxation of the smooth muscle is increased air movement and improved work of breathing.  This is a short acting medication that can be used every 4-6 hours as needed for increased work of breathing, shortness of breath, wheezing and excessive coughing.  I have provided you with a new prescription.    If your insurance will not cover your allergy medications, please consider downloading the [employer] app which is free.  You can find considerable discounts on prescription and over-the-counter medications.   If you find that you have not had improvement of your symptoms in the next 3 to 5 days, please follow-up with your primary care provider or return here to urgent care for repeat evaluation and further recommendations.   Thank you for visiting urgent care today.  We appreciate the opportunity to participate in your care.

## 2021-05-25 NOTE — Telephone Encounter (Signed)
Patient contacted by phone by provider.  Patient advised that chest x-ray is concerning for left pleural effusion and left lower lobe atelectasis.  Patient advised to continue doxycycline for right otitis media and to add Augmentin 1 tablet twice daily for 5 days for presumed pneumonia.  Patient verbalized understanding and agreed with plan of care.

## 2021-05-25 NOTE — ED Triage Notes (Signed)
Pt states she has been sick for about 8 days. She states she heard some rattling in her lungs today. Pt c/o cough and congestion.

## 2021-05-25 NOTE — ED Provider Notes (Signed)
[facility]    CSN: [id number] Arrival date & time: 05/25/21  1022    HISTORY   Chief Complaint  Patient presents with   Wheezing    I've had a very bad respiratory infection for 8 days now. I was checked in the ER on Tuesday and they believe it is viral. I've also had a fever for this whole time. This morning I woke up to a pulling in my lung and rattling. - Entered by patient   Cough   Nasal Congestion   HPI Emily Sharp is a 41 y.o. female. Patient presents urgent care with a chief complaint of an 8-day history of "very bad" respiratory infection.  Patient states she went to the emergency room 3 days ago.  EMR reviewed, per ED note, patient had temperature of 102.8, respiratory rate of 21, O2 sats of 95% and a heart rate of 112 on arrival.  Patient had a negative COVID and influenza test.  Chest x-ray was not concerning for active cardiopulmonary disease.  Patient was provided with Tylenol and IV rehydration and deemed stable for discharge to home.  Pt states she  heard some rattling in her lungs today. Pt c/o cough and congestion.  Patient is afebrile on arrival with a heart rate of 93, respiration of 20 and oxygen saturation of 94%.  Of note, patient weighs 41 kg.  Patient states she has a dry nonproductive cough that was initially wet and productive but now dry.  Patient states she suffers from seasonal allergies  The history is provided by the patient.  Past Medical History:  Diagnosis Date   Anxiety and depression    Asthma    Encounter for assisted reproductive fertility procedure cycle    Encounter for fertility testing    Female infertility associated with anovulation    Gestational diabetes    PCOS (polycystic ovarian syndrome)    Polyp of corpus uteri    Patient Active Problem List   Diagnosis Date Noted   Multiple gestation 06/10/2017   PIH (pregnancy induced hypertension) 06/10/2017   Gestational diabetes 06/10/2017   H/O cesarean section  06/10/2017   Dichorionic diamniotic twin pregnancy, antepartum 05/06/2017   Abnormal glucose tolerance test (GTT) during pregnancy, antepartum 04/25/2017   Bradycardia 05/09/2016   Abnormal ECG 05/09/2016   Sleep apnea 05/09/2016   Past Surgical History:  Procedure Laterality Date   BREAST REDUCTION SURGERY     CESAREAN SECTION MULTI-GESTATIONAL N/A 06/10/2017   Procedure: PRIMARY CESAREAN SECTION MULTI-GESTATIONAL;  Surgeon: Harold Hedgeomblin, James, MD;  Location: [facility];  Service: Obstetrics;  Laterality: N/A;  [name], RNFA   DILATION AND CURETTAGE OF UTERUS     INCISIONAL HERNIA REPAIR     OVARIAN CYST REMOVAL     WISDOM TOOTH EXTRACTION     OB History     Gravida  2   Para  1   Term  1   Preterm  0   AB  1   Living  2      SAB  1   IAB  0   Ectopic  0   Multiple  1   Live Births  2          Home Medications    Prior to Admission medications   Medication Sig Start Date End Date Taking? Authorizing Provider  albuterol (PROVENTIL HFA;VENTOLIN HFA) 108 (90 Base) MCG/ACT inhaler Inhale 2 puffs into the lungs every 4 (four) hours as needed for wheezing or shortness of breath.  [provider]  albuterol (VENTOLIN HFA) 108 (90 Base) MCG/ACT inhaler Inhale 2 puffs into the lungs every 6 (six) hours as needed for wheezing or shortness of breath. 02/05/21   Viviano Simas, FNP  benzonatate (TESSALON) 100 MG capsule Take 1 capsule (100 mg total) by mouth 3 (three) times daily as needed for cough. 02/05/21   Viviano Simas, FNP  fluticasone (FLONASE) 50 MCG/ACT nasal spray Place 1 spray into the nose daily as needed for allergies.     [provider]  ipratropium (ATROVENT) 0.03 % nasal spray Place 2 sprays into both nostrils every 12 (twelve) hours. 11/16/20   Margaretann Loveless, PA-C  ondansetron (ZOFRAN) 4 MG tablet Take 1 tablet (4 mg total) by mouth every 8 (eight) hours as needed for nausea or vomiting. 05/22/21   Horton, Clabe Seal, DO   predniSONE (DELTASONE) 20 MG tablet Take 2 tablets (40 mg total) by mouth daily with breakfast. 04/20/21   Margaretann Loveless, PA-C  Sertraline HCl (ZOLOFT PO)     [provider]   Family History Family History  Problem Relation Age of Onset   Skin cancer Mother    Diabetes Father    Peripheral vascular disease Father    Bladder Cancer Father    Hypertension Father    AAA (abdominal aortic aneurysm) Father    Skin cancer Father    Skin cancer Maternal Grandfather    Social History Social History   Tobacco Use   Smoking status: Never   Smokeless tobacco: Never  Substance Use Topics   Alcohol use: No   Drug use: Never   Allergies   Sulfa antibiotics and Ancef [cefazolin]  Review of Systems Review of Systems Pertinent findings noted in history of present illness.   Physical Exam Triage Vital Signs ED Triage Vitals  Enc Vitals Group     BP 11/03/20 0827 (!) 147/82     Pulse Rate 11/03/20 0827 72     Resp 11/03/20 0827 18     Temp 11/03/20 0827 98.3 F (36.8 C)     Temp Source 11/03/20 0827 Oral     SpO2 11/03/20 0827 98 %     Weight --      Height --      Head Circumference --      Peak Flow --      Pain Score 11/03/20 0826 5     Pain Loc --      Pain Edu? --      Excl. in GC? --   No data found.  Updated Vital Signs BP 106/72 (BP Location: Right Arm)   Pulse 93   Temp 98.4 F (36.9 C) (Oral)   Resp 20   LMP 05/04/2021 (Approximate)   SpO2 94%   Physical Exam Vitals and nursing note reviewed.  Constitutional:      General: She is not in acute distress.    Appearance: Normal appearance. She is not ill-appearing.  HENT:     Head: Normocephalic and atraumatic.     Salivary Glands: Right salivary gland is not diffusely enlarged or tender. Left salivary gland is not diffusely enlarged or tender.     Right Ear: Ear canal and external ear normal. No drainage. No middle ear effusion. There is no impacted cerumen. Tympanic membrane is injected,  erythematous and bulging (Purulent fluid behind TM).     Left Ear: Ear canal and external ear normal. No drainage.  No middle ear effusion. There is no impacted cerumen.  Tympanic membrane is bulging (Clear fluid behind TM). Tympanic membrane is not injected or erythematous.     Ears:     Comments: Bilateral EACs normal    Nose: Rhinorrhea present. No nasal deformity, septal deviation, signs of injury, nasal tenderness, mucosal edema or congestion. Rhinorrhea is clear.     Right Nostril: Occlusion present. No foreign body, epistaxis or septal hematoma.     Left Nostril: Occlusion present. No foreign body, epistaxis or septal hematoma.     Right Turbinates: Enlarged, swollen and pale.     Left Turbinates: Enlarged, swollen and pale.     Right Sinus: No maxillary sinus tenderness or frontal sinus tenderness.     Left Sinus: No maxillary sinus tenderness or frontal sinus tenderness.     Mouth/Throat:     Lips: Pink. No lesions.     Mouth: Mucous membranes are moist. No oral lesions.     Pharynx: Oropharynx is clear. Uvula midline. No posterior oropharyngeal erythema or uvula swelling.     Tonsils: No tonsillar exudate. 0 on the right. 0 on the left.     Comments: Postnasal drip Eyes:     General: Lids are normal.        Right eye: No discharge.        Left eye: No discharge.     Extraocular Movements: Extraocular movements intact.     Conjunctiva/sclera: Conjunctivae normal.     Right eye: Right conjunctiva is not injected.     Left eye: Left conjunctiva is not injected.  Neck:     Trachea: Trachea and phonation normal.  Cardiovascular:     Rate and Rhythm: Normal rate and regular rhythm.     Pulses: Normal pulses.     Heart sounds: Normal heart sounds. No murmur heard.   No friction rub. No gallop.  Pulmonary:     Effort: Pulmonary effort is normal. No tachypnea, bradypnea, accessory muscle usage, prolonged expiration, respiratory distress or retractions.     Breath sounds: No stridor,  decreased air movement or transmitted upper airway sounds. Examination of the right-middle field reveals rales. Examination of the left-middle field reveals rales. Examination of the right-lower field reveals rales. Examination of the left-lower field reveals rales. Rales present. No decreased breath sounds, wheezing or rhonchi.     Comments: Worse breath sounds throughout Chest:     Chest wall: No tenderness.  Musculoskeletal:        General: Normal range of motion.     Cervical back: Normal range of motion and neck supple. Normal range of motion.  Lymphadenopathy:     Cervical: No cervical adenopathy.  Skin:    General: Skin is warm and dry.     Findings: No erythema or rash.  Neurological:     General: No focal deficit present.     Mental Status: She is alert and oriented to person, place, and time.  Psychiatric:        Mood and Affect: Mood normal.        Behavior: Behavior normal.    Visual Acuity Right Eye Distance:   Left Eye Distance:   Bilateral Distance:    Right Eye Near:   Left Eye Near:    Bilateral Near:     UC Couse / Diagnostics / Procedures:    EKG  Radiology No results found.  Procedures Procedures (including critical care time)  UC Diagnoses / Final Clinical Impressions(s)   I have reviewed the triage vital signs and the nursing notes.  Pertinent labs & imaging results that were available during my care of the patient were reviewed by me and considered in my medical decision making (see chart for details).   Final diagnoses:  Respiratory infection  Perennial allergic rhinitis with seasonal variation  Rhinosinusitis  Acute suppurative otitis media of right ear   Patient reports history of hives with Ancef as well as Bactrim.  Patient provided with a 10-day course of doxycycline and advised to go to the med Center location to have her chest x-ray done.  Patient advised to follow-up in the next 3 to 5 days if not improving.  Patient advised to continue  albuterol, begin allergy medications if not currently taking Anusol as prescribed.  ED Prescriptions     Medication Sig Dispense Auth. Provider   doxycycline (ADOXA) 100 MG tablet Take 1 tablet (100 mg total) by mouth 2 (two) times daily for 10 days. 20 tablet Theadora Rama Scales, PA-C   fexofenadine (ALLEGRA) 180 MG tablet Take 1 tablet (180 mg total) by mouth daily. 90 tablet Theadora Rama Scales, PA-C   fluticasone (FLONASE) 50 MCG/ACT nasal spray Place 1 spray into both nostrils daily. Begin by using 2 sprays in each nare daily for 3 to 5 days, then decrease to 1 spray in each nare daily. 32 mL Theadora Rama Scales, PA-C   montelukast (SINGULAIR) 10 MG tablet Take 1 tablet (10 mg total) by mouth at bedtime. 30 tablet Theadora Rama Scales, PA-C   ipratropium (ATROVENT) 0.06 % nasal spray Place 2 sprays into both nostrils 3 (three) times daily. As needed for nasal congestion, runny nose 15 mL Theadora Rama Scales, PA-C   albuterol (VENTOLIN HFA) 108 (90 Base) MCG/ACT inhaler Inhale 2 puffs into the lungs every 6 (six) hours as needed for wheezing or shortness of breath (Cough). 18 g Theadora Rama Scales, PA-C      PDMP not reviewed this encounter.  Pending results:  Labs Reviewed - No data to display  Medications Ordered in UC: Medications - No data to display  Disposition Upon Discharge:  Condition: stable for discharge home Home: take medications as prescribed; routine discharge instructions as discussed; follow up as advised.  Patient presented with an acute illness with associated systemic symptoms and significant discomfort requiring urgent management. In my opinion, this is a condition that a prudent lay person (someone who possesses an average knowledge of health and medicine) may potentially expect to result in complications if not addressed urgently such as respiratory distress, impairment of bodily function or dysfunction of bodily organs.   Routine symptom  specific, illness specific and/or disease specific instructions were discussed with the patient and/or caregiver at length.   As such, the patient has been evaluated and assessed, work-up was performed and treatment was provided in alignment with urgent care protocols and evidence based medicine.  Patient/parent/caregiver has been advised that the patient may require follow up for further testing and treatment if the symptoms continue in spite of treatment, as clinically indicated and appropriate.  If the patient was tested for COVID-19, Influenza and/or RSV, then the patient/parent/guardian was advised to isolate at home pending the results of his/her diagnostic coronavirus test and potentially longer if they're positive. I have also advised pt that if his/her COVID-19 test returns positive, it's recommended to self-isolate for at least 10 days after symptoms first appeared AND until fever-free for 24 hours without fever reducer AND other symptoms have improved or resolved. Discussed self-isolation recommendations as well as instructions for household member/close  contacts as per the CDC and [place] DHHS, and also gave patient the COVID packet with this information.  Patient/parent/caregiver has been advised to return to the [facility] or PCP in 3-5 days if no better; to PCP or the Emergency Department if new signs and symptoms develop, or if the current signs or symptoms continue to change or worsen for further workup, evaluation and treatment as clinically indicated and appropriate  The patient will follow up with their current PCP if and as advised. If the patient does not currently have a PCP we will assist them in obtaining one.   The patient may need specialty follow up if the symptoms continue, in spite of conservative treatment and management, for further workup, evaluation, consultation and treatment as clinically indicated and appropriate.  Patient/parent/caregiver verbalized understanding and agreement  of plan as discussed.  All questions were addressed during visit.  Please see discharge instructions below for further details of plan.  Discharge Instructions:   Discharge Instructions      Your symptoms and physical exam findings are concerning for a viral respiratory infection and uncontrolled seasonal allergies that if not only flared up your asthma symptoms but have also caused you to develop a bacterial infection in your right inner ear.    As we discussed, please go to the [facility] now for chest x-ray to evaluate you for possible pneumonia.  Your breath sounds are rough at this time and I think it is important to rule pneumonia out.    Please see the list below for recommended medications, dosages and frequencies to provide relief of your current symptoms:    Doxycycline: Please take 1 tablet twice daily for the next 10 days to resolve the infection that is in your right ear that is likely related to some bacterial infection in your sinuses and may be contributing to your cough.  Doxycycline has been prescribed to you in the past and since you do not recall any allergic reactions to biotics in the past few years I believe this will be safe for you to take.  Please keep in mind the doxycycline should not be taken at the same time as any dairy products because it binds with calcium and makes it inert.  Please also avoid direct exposure to the summer you are taking doxycycline, it can make your skin more sensitive to harmful UV rays.  It is very important that you take antibiotics as prescribed.  If you skip doses or do not complete the full course of antibiotics, you put yourself at significant risk of recurrent infection which can often be worse than your initial infection.   Allegra (fexofenadine): This is an excellent second-generation antihistamine that helps to reduce respiratory inflammatory response to environmental allergens.  This medication is  not known to cause daytime sleepiness so it can be taken in the daytime.  If you find that it does make you sleepy, please feel free to take it at bedtime.  I have provided you with a new prescription for this medication   Singulair (montelukast): This is a mast cell stabilizer that works well with antihistamines.  Mast cells are responsible for stimulating histamine production so you can imagine that if we can reduce the activity of your mast cells, then fewer histamines will be produced and inflammation caused by allergy exposure will be significantly reduced.  I recommend that you take this medication at the same time you take your antihistamine.  I recommend that you  continue this medication for the next 30 days and longer if you feel that it is very helpful.   Flonase (fluticasone): This is a steroid nasal spray that you use once daily, 1 spray in each nare.  This medication does not work well if you decide to use it only used as you feel you need to, it works best used on a daily basis.  After 3 to 5 days of use, you will notice significant reduction of the inflammation and mucus production that is currently being caused by exposure to allergens, whether seasonal or environmental.  The most common side effect of this medication is nosebleeds.  If you experience a nosebleed, please discontinue use for 1 week, then feel free to resume.  I have provided you with a new prescription.     Atrovent (ipratropium): This is an excellent nasal decongestant spray I have added to your recommended nasal steroid that will not cause rebound congestion, please instill 2 sprays into each nare with each use.  Because nasal steroids can take several days before they begin to provide full benefit, I recommend that you use this spray in addition to the nasal steroid prescribed for you.  Please use it after you have used your nasal steroid and repeat up to 4 times daily as needed.  I have provided you with a new prescription  for this medication.      ProAir, Ventolin, Proventil (albuterol): This inhaled medication contains a short acting beta agonist bronchodilator, this is the inhaler that was provided to you in the emergency room.  This medication works on the smooth muscle that opens and constricts of your airways by relaxing the muscle.  The result of relaxation of the smooth muscle is increased air movement and improved work of breathing.  This is a short acting medication that can be used every 4-6 hours as needed for increased work of breathing, shortness of breath, wheezing and excessive coughing.  I have provided you with a new prescription.    If your insurance will not cover your allergy medications, please consider downloading the [employer] app which is free.  You can find considerable discounts on prescription and over-the-counter medications.   If you find that you have not had improvement of your symptoms in the next 3 to 5 days, please follow-up with your primary care provider or return here to urgent care for repeat evaluation and further recommendations.   Thank you for visiting urgent care today.  We appreciate the opportunity to participate in your care.       This office note has been dictated using Teaching laboratory technician.  Unfortunately, and despite my best efforts, this method of dictation can sometimes lead to occasional typographical or grammatical errors.  I apologize in advance if this occurs.     Theadora Rama Scales, PA-C 05/25/21 1203

## 2021-05-28 ENCOUNTER — Telehealth: Payer: Self-pay | Admitting: Emergency Medicine

## 2021-05-28 ENCOUNTER — Telehealth: Payer: BC Managed Care – PPO | Admitting: Family Medicine

## 2021-05-28 DIAGNOSIS — J4551 Severe persistent asthma with (acute) exacerbation: Secondary | ICD-10-CM

## 2021-05-28 DIAGNOSIS — R0602 Shortness of breath: Secondary | ICD-10-CM

## 2021-05-28 MED ORDER — PREDNISONE 20 MG PO TABS: 40.0000 mg | ORAL_TABLET | Freq: Every day | ORAL | 0 refills | Status: DC

## 2021-05-28 NOTE — Telephone Encounter (Signed)
Patient is still having significant difficulty with her breathing and would like to trial a course of prednisone.  She has asthma and has been using her albuterol inhaler.  Gets very temporary relief with this.  She did have 2 visits 1 through the emergency room and then 1 subsequently at this site.  She is currently taking Augmentin and doxycycline.  I will not change her regimen however Augmentin should suffice for community-acquired pneumonia and otitis media.  I did not have patient change her antibiotics.  I did review the x-ray results which suggest a small pleural effusion.  Counseled that the use of steroids can make this worse but patient would still like to trial prednisone.  We will do 40 mg for the next 5 days.  Patient is to present to the emergency room if her symptoms persist and definitely if they worsen.  At that point it would be more appropriate to obtain a chest CT scan as she has had 2 chest x-rays within the past week.  Patient verbalizes understanding and is in agreement.

## 2021-05-28 NOTE — Progress Notes (Signed)
[place]   Pt with presumed PNA on xray- having chest tightness and shortness of breath and discomfort- I have messaged her that she should follow up in person for eval. Might need steroid but oxygen levels should be checked as well as BP.

## 2021-06-04 ENCOUNTER — Encounter (HOSPITAL_BASED_OUTPATIENT_CLINIC_OR_DEPARTMENT_OTHER): Payer: Self-pay | Admitting: Emergency Medicine

## 2021-06-04 ENCOUNTER — Ambulatory Visit
Admission: EM | Admit: 2021-06-04 | Discharge: 2021-06-04 | Disposition: A | Discharge status: Home / Self Care | Payer: BC Managed Care – PPO

## 2021-06-04 ENCOUNTER — Emergency Department (HOSPITAL_BASED_OUTPATIENT_CLINIC_OR_DEPARTMENT_OTHER)
Admission: EM | Admit: 2021-06-04 | Discharge: 2021-06-04 | Disposition: A | Discharge status: Home / Self Care | Payer: BC Managed Care – PPO | Attending: Emergency Medicine | Admitting: Emergency Medicine

## 2021-06-04 ENCOUNTER — Other Ambulatory Visit: Payer: Self-pay

## 2021-06-04 ENCOUNTER — Emergency Department (HOSPITAL_BASED_OUTPATIENT_CLINIC_OR_DEPARTMENT_OTHER): Payer: BC Managed Care – PPO | Admitting: Radiology

## 2021-06-04 DIAGNOSIS — R0602 Shortness of breath: Secondary | ICD-10-CM | POA: Diagnosis present

## 2021-06-04 DIAGNOSIS — J189 Pneumonia, unspecified organism: Secondary | ICD-10-CM | POA: Insufficient documentation

## 2021-06-04 LAB — CBC WITH DIFFERENTIAL/PLATELET
Abs Immature Granulocytes: 0.12 10*3/uL — ABNORMAL HIGH (ref 0.00–0.07)
Basophils Absolute: 0.1 10*3/uL (ref 0.0–0.1)
Basophils Relative: 1 %
Eosinophils Absolute: 0.1 10*3/uL (ref 0.0–0.5)
Eosinophils Relative: 1 %
HCT: 43.7 % (ref 36.0–46.0)
Hemoglobin: 13.5 g/dL (ref 12.0–15.0)
Immature Granulocytes: 1 %
Lymphocytes Relative: 24 %
Lymphs Abs: 3.7 10*3/uL (ref 0.7–4.0)
MCH: 28.1 pg (ref 26.0–34.0)
MCHC: 30.9 g/dL (ref 30.0–36.0)
MCV: 91 fL (ref 80.0–100.0)
Monocytes Absolute: 1 10*3/uL (ref 0.1–1.0)
Monocytes Relative: 7 %
Neutro Abs: 10.6 10*3/uL — ABNORMAL HIGH (ref 1.7–7.7)
Neutrophils Relative %: 66 %
Platelets: 473 10*3/uL — ABNORMAL HIGH (ref 150–400)
RBC: 4.8 MIL/uL (ref 3.87–5.11)
RDW: 14.1 % (ref 11.5–15.5)
WBC: 15.7 10*3/uL — ABNORMAL HIGH (ref 4.0–10.5)
nRBC: 0 % (ref 0.0–0.2)

## 2021-06-04 LAB — BASIC METABOLIC PANEL
Anion gap: 10 (ref 5–15)
BUN: 24 mg/dL — ABNORMAL HIGH (ref 6–20)
CO2: 24 mmol/L (ref 22–32)
Calcium: 9.4 mg/dL (ref 8.9–10.3)
Chloride: 102 mmol/L (ref 98–111)
Creatinine, Ser: 0.87 mg/dL (ref 0.44–1.00)
GFR, Estimated: >60 mL/min (ref >60)
Glucose, Bld: 97 mg/dL (ref 70–99)
Potassium: 4.2 mmol/L (ref 3.5–5.1)
Sodium: 136 mmol/L (ref 135–145)

## 2021-06-04 MED ORDER — CIPROFLOXACIN HCL 500 MG PO TABS: 500.0000 mg | ORAL_TABLET | Freq: Two times a day (BID) | ORAL | 0 refills | Status: AC

## 2021-06-04 NOTE — ED Triage Notes (Signed)
Pt states she was diagnosed with PNA and just finished her Doxycyline but feels like she still has PNA. She c/o lung aching, cough and SOB.

## 2021-06-04 NOTE — ED Provider Notes (Signed)
Patient was seen on May 25, 2021 where she was diagnosed with acute suppurative otitis media of her right ear, patient was provided with a prescription of doxycycline to treat her otitis media due to a history of hives while taking Ancef and Bactrim and a personal request to not have to take amoxicillin because it gives her vaginal yeast infections.  X-ray was ordered at outside facility during her visit that day due to not having radiology tech onsite, x-ray revealed small left-sided pleural effusion, patient was contacted by phone advised to add Augmentin to her regimen for community-acquired pneumonia, doxycycline was continued for broad-spectrum coverage due to patient history of asthma and PCOS/gestational diabetes for which patient has not had any follow-up since 2019.  Patient reached out to urgent care provider on May 28, 2021 regarding her continued difficulty of breathing.  During that telephone visit, she was provided with a prescription of prednisone 40 mg daily for 5 days and albuterol.  During the phone conversation, patient was advised that if she had any worsening respiratory symptoms that she should go to the emergency room because CT scan will be indicated.  Patient presents here to urgent care today complaining of worsening respiratory symptoms, including continued cough, shortness of breath, along aching.  Oxygen saturation on arrival is 93% with a heart rate of 109.  Patient is afebrile with normal blood pressure.  Patient was reminded that she was advised to go to the emergency room should she not have improvement of her work of breathing and respiratory symptoms after completing prednisone for 5 days.  Patient verbalized recall of this information and agreed to go to the [facility] [place] location now.   Lynden Oxford Scales, [place] 06/04/21 [phone]

## 2021-06-04 NOTE — ED Triage Notes (Signed)
Pt was dx with pneumonia, plural effusion and lung pain x 1 week ago returned to urgent care today for continued SOB and pain and was advised to come to ER for CT scan.

## 2021-06-04 NOTE — ED Provider Notes (Signed)
[facility] Provider [facility]   CSN: [id number] Arrival date & time: 06/04/21  1653     History  Chief Complaint  Patient presents with   Shortness of Breath    Emily Sharp is a 41 y.o. female.  Patient is a [age] year old female recently diagnosed with pneumonia and left-sided pleural effusion approximately 1 week ago presenting for reevaluation.  Patient states approximate 1 week ago she began having fevers, chills, myalgias, cough, and pain in her lungs.  Patient states she was already taking doxycycline for an ear infection and given a 7-day course of Augmentin that she has now finished.  Patient states that her fevers, chills, myalgias have resolved.  States the cough is still present but improving.  She is still having lung pain is worse when lying flat on the left side.  The history is provided by the patient. No language interpreter was used.  Shortness of Breath Associated symptoms: cough   Associated symptoms: no abdominal pain, no chest pain, no ear pain, no fever, no rash, no sore throat and no vomiting       Home Medications Prior to Admission medications   Medication Sig Start Date End Date Taking? Authorizing Provider  ciprofloxacin (CIPRO) 500 MG tablet Take 1 tablet (500 mg total) by mouth every 12 (twelve) hours for 5 days. 06/04/21 06/09/21 Yes Edwin DadaGray, Aiyonna Lucado P, DO  albuterol (VENTOLIN HFA) 108 (90 Base) MCG/ACT inhaler Inhale 2 puffs into the lungs every 6 (six) hours as needed for wheezing or shortness of breath (Cough). 05/25/21   Theadora RamaMorgan, Lindsay Scales, PA-C  benzonatate (TESSALON) 100 MG capsule Take 1 capsule (100 mg total) by mouth 3 (three) times daily as needed for cough. 02/05/21   Viviano SimasParker, Sarah, FNP  doxycycline (ADOXA) 100 MG tablet Take 1 tablet (100 mg total) by mouth 2 (two) times daily for 10 days. 05/25/21 06/04/21  Theadora RamaMorgan, Lindsay Scales, PA-C  fexofenadine (ALLEGRA) 180 MG tablet Take 1 tablet (180 mg total) by mouth daily.  05/25/21 11/21/21  Theadora RamaMorgan, Lindsay Scales, PA-C  fluticasone (FLONASE) 50 MCG/ACT nasal spray Place 1 spray into both nostrils daily. Begin by using 2 sprays in each nare daily for 3 to 5 days, then decrease to 1 spray in each nare daily. 05/25/21   Theadora RamaMorgan, Lindsay Scales, PA-C  ipratropium (ATROVENT) 0.06 % nasal spray Place 2 sprays into both nostrils 3 (three) times daily. As needed for nasal congestion, runny nose 05/25/21   Theadora RamaMorgan, Lindsay Scales, PA-C  montelukast (SINGULAIR) 10 MG tablet Take 1 tablet (10 mg total) by mouth at bedtime. 05/25/21 08/23/21  Theadora RamaMorgan, Lindsay Scales, PA-C  ondansetron (ZOFRAN) 4 MG tablet Take 1 tablet (4 mg total) by mouth every 8 (eight) hours as needed for nausea or vomiting. 05/22/21   Horton, Clabe SealKristie M, DO  predniSONE (DELTASONE) 20 MG tablet Take 2 tablets (40 mg total) by mouth daily with breakfast. 05/28/21   Wallis BambergMani, Mario, PA-C  Sertraline HCl (ZOLOFT PO)     [provider]      Allergies    Sulfa antibiotics and Ancef [cefazolin]    Review of Systems   Review of Systems  Constitutional:  Negative for chills and fever.  HENT:  Negative for ear pain and sore throat.   Eyes:  Negative for pain and visual disturbance.  Respiratory:  Positive for cough and shortness of breath.   Cardiovascular:  Negative for chest pain and palpitations.  Gastrointestinal:  Negative for abdominal pain and vomiting.  Genitourinary:  Negative for dysuria and hematuria.  Musculoskeletal:  Negative for arthralgias and back pain.  Skin:  Negative for color change and rash.  Neurological:  Negative for seizures and syncope.  All other systems reviewed and are negative.  Physical Exam Updated Vital Signs BP 106/71 (BP Location: Left Arm)   Pulse 96   Temp 97.9 F (36.6 C) (Temporal)   Resp 18   Ht 5\' 8"  (1.727 m)   Wt 122.4 kg   LMP 06/01/2021 (Exact Date)   SpO2 98%   BMI 41.03 kg/m  Physical Exam Vitals and nursing note reviewed.  Constitutional:       General: She is not in acute distress.    Appearance: She is well-developed.  HENT:     Head: Normocephalic and atraumatic.  Eyes:     Conjunctiva/sclera: Conjunctivae normal.  Cardiovascular:     Rate and Rhythm: Normal rate and regular rhythm.     Heart sounds: No murmur heard. Pulmonary:     Effort: Pulmonary effort is normal. No respiratory distress.     Breath sounds: Normal breath sounds.  Abdominal:     Palpations: Abdomen is soft.     Tenderness: There is no abdominal tenderness.  Musculoskeletal:        General: No swelling.     Cervical back: Neck supple.  Skin:    General: Skin is warm and dry.     Capillary Refill: Capillary refill takes less than 2 seconds.  Neurological:     Mental Status: She is alert.  Psychiatric:        Mood and Affect: Mood normal.    ED Results / Procedures / Treatments   Labs (all labs ordered are listed, but only abnormal results are displayed) Labs Reviewed  CBC WITH DIFFERENTIAL/PLATELET - Abnormal; Notable for the following components:      Result Value   WBC 15.7 (*)    Platelets 473 (*)    Neutro Abs 10.6 (*)    Abs Immature Granulocytes 0.12 (*)    All other components within normal limits  BASIC METABOLIC PANEL - Abnormal; Notable for the following components:   BUN 24 (*)    All other components within normal limits    EKG None  Radiology DG Chest 2 View  Result Date: 06/04/2021 CLINICAL DATA:  Shortness of breath EXAM: CHEST - 2 VIEW COMPARISON:  05/25/2021 FINDINGS: Improving left lower lobe opacity, possibly atelectasis/pneumonia, with associated left pleural effusion. Right lung is clear.  No pneumothorax. The heart is normal in size. Visualized osseous structures are within normal limits. IMPRESSION: Improving left lower lobe opacity, possibly atelectasis/pneumonia, with associated left pleural effusion. Continue follow-up is suggested in 3-4 weeks to document resolution. Electronically Signed   By: 05/27/2021  M.D.   On: 06/04/2021 18:12    Procedures Procedures    Medications Ordered in ED Medications - No data to display  ED Course/ Medical Decision Making/ A&P                           Medical Decision Making Amount and/or Complexity of Data Reviewed Labs: ordered. Radiology: ordered.   71:43 PM 41 year old female recently diagnosed with pneumonia and left-sided pleural effusion approximately 1 week ago presenting for reevaluation.  Exam patient is well-appearing, alert and oriented x3, no acute distress, afebrile, stable vital signs.  Physical exam demonstrates equal bilateral breath sounds with no adventitious lung sounds.  No hypoxia or signs of  respiratory distress.  Laboratory studies demonstrate leukocytosis likely secondary to steroid use.  Chest x-ray demonstrates treatment of left lobe opacity with continue of left pleural effusion.  At this time patient's symptoms and chest x-ray stated improving pneumonia.  She is safe for discharge at this time with close follow-up with her PCP in the next 7 days for repeat imaging follow-up pleural effusion to resolution.  Patient in no distress and overall condition improved here in the ED. Detailed discussions were had with the patient regarding current findings, and need for close f/u with PCP or on call doctor. The patient has been instructed to return immediately if the symptoms worsen in any way for re-evaluation. Patient verbalized understanding and is in agreement with current care plan. All questions answered prior to discharge.         Final Clinical Impression(s) / ED Diagnoses Final diagnoses:  Community acquired pneumonia of left lower lobe of lung    Rx / DC Orders ED Discharge Orders          Ordered    ciprofloxacin (CIPRO) 500 MG tablet  Every 12 hours        06/04/21 1918              Franne Forts, DO 06/04/21 1919

## 2021-06-04 NOTE — ED Notes (Signed)
Pt verbalizes understanding of discharge instructions. Opportunity for questioning and answers were provided. Pt discharged from ED to home.   ? ?

## 2021-06-04 NOTE — ED Notes (Signed)
Patient is being discharged from the Urgent Care and sent to the Emergency Department via POA. Per L. Morgan-Scales PA-C , patient is in need of higher level of care due to need for further evaluation. Patient is aware and verbalizes understanding of plan of care.  Vitals:   06/04/21 1629  BP: 131/82  Pulse: (!) 109  Resp: 16  Temp: 98 F (36.7 C)  SpO2: 93%

## 2021-06-28 ENCOUNTER — Encounter (HOSPITAL_BASED_OUTPATIENT_CLINIC_OR_DEPARTMENT_OTHER): Payer: Self-pay | Admitting: Nurse Practitioner

## 2021-06-28 ENCOUNTER — Ambulatory Visit (HOSPITAL_BASED_OUTPATIENT_CLINIC_OR_DEPARTMENT_OTHER): Payer: BC Managed Care – PPO | Admitting: Nurse Practitioner

## 2021-06-28 ENCOUNTER — Ambulatory Visit (INDEPENDENT_AMBULATORY_CARE_PROVIDER_SITE_OTHER): Payer: BC Managed Care – PPO

## 2021-06-28 VITALS — BP 117/72 | HR 96 | Ht 68.0 in | Wt 264.8 lb

## 2021-06-28 DIAGNOSIS — J189 Pneumonia, unspecified organism: Secondary | ICD-10-CM

## 2021-06-28 DIAGNOSIS — K439 Ventral hernia without obstruction or gangrene: Secondary | ICD-10-CM

## 2021-06-28 DIAGNOSIS — F419 Anxiety disorder, unspecified: Secondary | ICD-10-CM

## 2021-06-28 DIAGNOSIS — F32A Depression, unspecified: Secondary | ICD-10-CM

## 2021-06-28 MED ORDER — FLUOXETINE HCL 40 MG PO CAPS: 40.0000 mg | ORAL_CAPSULE | Freq: Every day | ORAL | 3 refills | Status: DC

## 2021-06-28 MED ORDER — FLUOXETINE HCL 20 MG PO TABS: ORAL_TABLET | ORAL | 0 refills | Status: DC

## 2021-06-28 NOTE — Progress Notes (Addendum)
Emily Eth, DNP, AGNP-c Primary Care & Sports Medicine [address]  [address] [place], [place] [address] [phone]  New patient visit   Patient: Emily Sharp   DOB: [birth year]   41 y.o. Female  MRN: [id number] Visit Date: 06/28/2021  Patient Care Team: Emily Eth, NP as PCP - General (Nurse Practitioner)  Today's Vitals   06/28/21 0928  BP: 117/72  Pulse: 96  SpO2: 99%  Weight: 264 lb 12.8 oz (120.1 kg)  Height: 5\' 8"  (1.727 m)   Body mass index is 40.26 kg/m.   Today's healthcare provider: , NP   Chief Complaint  Patient presents with   New Patient (Initial Visit)    Patient presents today to establish care.Patient is still recovering from pneumonia.   Subjective    Emily Sharp is a 41 y.o. female who presents today as a new patient to establish care.    Patient endorses the following concerns presently: Pneumonia Recent Dx of penumonia with pleural effusion by ED.  She was initially seen 05/16 for febrile illness, but no abx therapy started at that time due to clear CXR, dx viral infection Visit to [facility] 05/19 showed otitis media with dx of rhinosinusitis and otitis. She was started on doxycycline at that time.  After DC patient was contacted and informed that CXR showed L pleural effusion and LLL atelectasis. Augmentin was added for presumed pna.  On 05/22 patient was not feeling better and tried an e-visit, but was directed to seek in person care based on symptoms. She was able to reach an UC provider and was prescribed prednisone 40mg  for 5days.  On 05/29 she presented to the UC again with O2 sats at 93%. She was advised to go to ED. ED performed CXR which showed improvement of lung, but continued symptoms. At that time she was started on Cipro BID.   Today she tells me she has completed the abx and steroids, but she is still having significant symptoms of cough and increased mucous production. She tells me that  overall she does not feel well and is concerned about continued infection.  She reports it was recommended at her last ED appt that she have a f/u CXR in about 2 weeks, which she feels is due about this time.    Hernia  Increased since having pneumonia  She tells me that this has been there for 4 years She reports increased in size and protrusion with increased coughing She has been having bowel movements  She denies pain, fever, or color changes to the abdomen at the area of herniation  Weight Gain Has been on sertraline 100mg  for about the past year, but weight gain has been significant since starting Feels like her symptoms are well controlled with this medication and dose, but does not like the weight gain Would like to see about switching to fluoxetine     History reviewed and reveals the following: Past Medical History:  Diagnosis Date   Abnormal glucose tolerance test (GTT) during pregnancy, antepartum 04/25/2017   Anxiety and depression    Asthma    Bradycardia 05/09/2016   Dichorionic diamniotic twin pregnancy, antepartum 05/06/2017   Encounter for assisted reproductive fertility procedure cycle    Encounter for fertility testing    Female infertility associated with anovulation    Gestational diabetes    Gestational diabetes 06/10/2017   H/O cesarean section 06/10/2017   Multiple gestation 06/10/2017   PCOS (polycystic ovarian syndrome)  PIH (pregnancy induced hypertension) 06/10/2017   Polyp of corpus uteri    Past Surgical History:  Procedure Laterality Date   BREAST REDUCTION SURGERY     CESAREAN SECTION MULTI-GESTATIONAL N/A 06/10/2017   Procedure: PRIMARY CESAREAN SECTION MULTI-GESTATIONAL;  Surgeon: Harold Hedge, MD;  Location: [facility];  Service: Obstetrics;  Laterality: N/A;  [name], RNFA   DILATION AND CURETTAGE OF UTERUS     INCISIONAL HERNIA REPAIR     OVARIAN CYST REMOVAL     WISDOM TOOTH EXTRACTION     Family Status  Relation Name Status    Mother  Alive   Father  Alive   MGF  (Not Specified)   Family History  Problem Relation Age of Onset   Skin cancer Mother    Diabetes Father    Peripheral vascular disease Father    Bladder Cancer Father    Hypertension Father    AAA (abdominal aortic aneurysm) Father    Skin cancer Father    Skin cancer Maternal Grandfather    Social History   Socioeconomic History   Marital status: Married    Spouse name: Emily Sharp   Number of children: 2   Years of education: Not on file   Highest education level: Not on file  Occupational History   Occupation: Runner, broadcasting/film/video  Tobacco Use   Smoking status: Never   Smokeless tobacco: Never  Vaping Use   Vaping Use: Not on file  Substance and Sexual Activity   Alcohol use: Yes    Comment: soical   Drug use: Never   Sexual activity: Yes    Birth control/protection: None, Other-see comments    Comment: Husband had vascotomy  Other Topics Concern   Not on file  Social History Narrative   ** Merged History Encounter **       Lives with husband.   Social Determinants of Health   Financial Resource Strain: Not on file  Food Insecurity: Not on file  Transportation Needs: Not on file  Physical Activity: Not on file  Stress: Not on file  Social Connections: Not on file   Outpatient Medications Prior to Visit  Medication Sig   albuterol (VENTOLIN HFA) 108 (90 Base) MCG/ACT inhaler Inhale 2 puffs into the lungs every 6 (six) hours as needed for wheezing or shortness of breath (Cough).   benzonatate (TESSALON) 100 MG capsule Take 1 capsule (100 mg total) by mouth 3 (three) times daily as needed for cough.   fluticasone (FLONASE) 50 MCG/ACT nasal spray Place 1 spray into both nostrils daily. Begin by using 2 sprays in each nare daily for 3 to 5 days, then decrease to 1 spray in each nare daily.   [DISCONTINUED] Sertraline HCl (ZOLOFT PO)    [DISCONTINUED] fexofenadine (ALLEGRA) 180 MG tablet Take 1 tablet (180 mg total) by mouth daily. (Patient not  taking: Reported on 06/28/2021)   [DISCONTINUED] ipratropium (ATROVENT) 0.06 % nasal spray Place 2 sprays into both nostrils 3 (three) times daily. As needed for nasal congestion, runny nose (Patient not taking: Reported on 06/28/2021)   [DISCONTINUED] montelukast (SINGULAIR) 10 MG tablet Take 1 tablet (10 mg total) by mouth at bedtime. (Patient not taking: Reported on 06/28/2021)   [DISCONTINUED] ondansetron (ZOFRAN) 4 MG tablet Take 1 tablet (4 mg total) by mouth every 8 (eight) hours as needed for nausea or vomiting. (Patient not taking: Reported on 06/28/2021)   [DISCONTINUED] predniSONE (DELTASONE) 20 MG tablet Take 2 tablets (40 mg total) by mouth daily with breakfast. (Patient not  taking: Reported on 06/28/2021)   No facility-administered medications prior to visit.   Allergies  Allergen Reactions   Sulfa Antibiotics Hives    HIVES, RASH   Ancef [Cefazolin] Rash and Cough   Immunization History  Administered Date(s) Administered   Influenza Split 09/03/2013   Influenza,inj,Quad PF,6+ Mos 11/13/2014, 12/20/2015   PFIZER Comirnaty(Gray Top)Covid-19 Tri-Sucrose Vaccine 08/15/2020   Pneumococcal Polysaccharide-23 04/13/2013    Review of Systems All review of systems negative except what is listed in the HPI   Objective    BP 117/72   Pulse 96   Ht 5\' 8"  (1.727 m)   Wt 264 lb 12.8 oz (120.1 kg)   LMP 06/01/2021 (Exact Date)   SpO2 99%   BMI 40.26 kg/m  Physical Exam Vitals and nursing note reviewed.  Constitutional:      General: She is not in acute distress.    Appearance: She is ill-appearing. She is not diaphoretic.  HENT:     Head: Normocephalic.     Ears:     Comments: Erythema to the meatus with no effusion present bilaterally.     Nose: Congestion and rhinorrhea present.     Mouth/Throat:     Pharynx: Oropharynx is clear. Posterior oropharyngeal erythema present.  Eyes:     Extraocular Movements: Extraocular movements intact.     Pupils: Pupils are equal, round,  and reactive to light.  Neck:     Vascular: No carotid bruit.  Cardiovascular:     Rate and Rhythm: Normal rate and regular rhythm.     Pulses: Normal pulses.     Heart sounds: Normal heart sounds.  Pulmonary:     Effort: Pulmonary effort is normal.     Breath sounds: Rhonchi and rales present.  Abdominal:     Palpations: Abdomen is soft.     Hernia: A hernia is present.  Musculoskeletal:     Cervical back: Normal range of motion.     Right lower leg: No edema.     Left lower leg: No edema.  Lymphadenopathy:     Cervical: Cervical adenopathy present.  Skin:    General: Skin is warm and dry.     Capillary Refill: Capillary refill takes less than 2 seconds.  Neurological:     General: No focal deficit present.     Mental Status: She is alert and oriented to person, place, and time.     Motor: No weakness.     Coordination: Coordination normal.  Psychiatric:        Mood and Affect: Mood normal.        Behavior: Behavior normal.        Thought Content: Thought content normal.        Judgment: Judgment normal.     No results found for any visits on 06/28/21.  Assessment & Plan      Problem List Items Addressed This Visit     Pneumonia of left lung due to infectious organism - Primary    Initial onset of pneumonia mid May with antibiotic therapy over 3-week.  Including doxycycline, Augmentin, and ciprofloxacin.  She has completed all 3 of these antibiotics at this time.  She has also had 1 round of 40 mg prednisone 5-day burst. Most recent chest x-ray on 5/29.  At that time pneumonia appeared to be improving. Today she continues to have symptoms consistent with pneumonia.  Rhonchi and rails are audible and she is experiencing a productive cough.  She is afebrile at this  time however in general continues to feel unwell. Given the patient's ongoing symptoms I do feel it is appropriate to repeat chest x-ray to ensure that pneumonia has not worsened.  Will wait for chest x-ray  results to return to determine if antibiotic therapy is warranted at this time. Recommend continuation of Mucinex, Tessalon, fluticasone, and albuterol. We will make changes to plan of care based on findings of x-ray.      Relevant Orders   DG Chest 2 View (Completed)   Hernia of anterior abdominal wall    Significant palpable herniation present to the abdomen just right of the midline.  With increased intrathoracic pressure the herniation does become significantly larger.  Given the patient's increased coughing over the past month I do feel that this has caused a worsening of this condition and at this time warrants surgical evaluation for consideration for repair.  We will send referral for surgery.  Patient is not having any alarm symptoms at this time.  Discussed symptoms that would warrant immediate repeat evaluation.  Patient expressed understanding.      Relevant Orders   Ambulatory referral to General Surgery   Anxiety and depression    Chronic.  Concerns with weight gain with sertraline.  Patient would like to switch to fluoxetine to see if this helps reduce this side effect.  I do feel that this is acceptable to trial the transition.  She is currently on the lowest dose of sertraline therefore no taper is required.  We will plan to start low-dose fluoxetine tomorrow morning and we can follow-up in the next 3 to 4 weeks to see how she is doing on the new treatment.  No SI/HI today.      Relevant Medications   FLUoxetine (PROZAC) 40 MG capsule (Start on 07/28/2021)     Return in about 4 weeks (around 07/26/2021) for medication change f/u and cough.      Tullio Chausse, Sung Amabile, NP, DNP, AGNP-C Primary Care & Sports Medicine at [facility]

## 2021-06-28 NOTE — Patient Instructions (Addendum)
Thank you for choosing [place] at [facility] for your Primary Care needs. I am excited for the opportunity to partner with you to meet your health care goals. It was a pleasure meeting you today!  Recommendations from today's visit: Taper from Sertraline to Fluoxetine Sertraline decrease dose to 62m (1/2 tab) and start Fluoxetine 172min the morning. Stay on this dose for 7 days Sertraline decrease dose to 5012m1/2 tab) at bedtime every other day and increase Fluoxetine to 63m42m the morning daily. Stay on this dose for 7 days Stop sertraline completely and increase Fluoxetine to 40mg64mthe morning daily.   I have sent the order for the chest x-ray and will be in touch with you with the results and recommendations [facility] [id number]: Monday - Friday  8:00 am - 5:00 pm Phone: [phone] the order for surgery for you- if you do not hear from them in the next 7-10 days, let me know.   Information on diet, exercise, and health maintenance recommendations are listed below. This is information to help you be sure you are on track for optimal health and monitoring.   Please look over this and let us kn[place] if you have any questions or if you have completed any of the health maintenance outside of [facility] we can be sure your records are up to date.  ___________________________________________________________ About Me: I am an Adult-Geriatric Nurse Practitioner with a background in caring for patients for more than 20 years with a strong intensive care background. I provide primary care and sports medicine services to patients age 40 an68older within this office. My education had a strong focus on caring for the older adult population, which I  am passionate about. I am also the director of the APP [facility] desire is to provide you with the best service through preventive medicine and supportive care. I consider you a part of the medical team and value your input. I work diligently to ensure that you are heard and your needs are met in a safe and effective manner. I want you to feel comfortable with me as your provider and want you to know that your health concerns are important to me.  For your information, our office hours are: Monday, Tuesday, and Thursday 8:00 AM - 5:00 PM Wednesday and Friday 8:00 AM - 12:00 PM.   In my time away from the office I am teaching new APP's within the system and am unavailable, but my partner, Dr. deCubBurnard Buntingn the office for emergent needs.   If you have questions or concerns, please call our office at [phone] us a [place]Chart message and we will respond as quickly as possible.  ____________________________________________________________ MyChart:  For all urgent or time sensitive needs we ask that you please call the office to avoid delays. Our number is [phone]. MyChart is not constantly monitored and due to the large volume of messages a day, replies may take up to 72 business hours.  MyChart Policy: MyChart allows for you to see your visit notes, after visit summary, provider recommendations, lab and tests results, make an appointment, request refills, and contact your provider or the office for non-urgent questions or concerns. Providers  are seeing patients during normal business hours and do not have built in time to review MyChart messages.  We ask that you allow a minimum of 3 business days for responses to [employer]. For this reason, please do not send urgent requests through [place]. Please call the office at [phone]. New and ongoing conditions may require a visit. We have virtual and in person visit available for your convenience.  Complex  MyChart concerns may require a visit. Your provider may request you schedule a virtual or in person visit to ensure we are providing the best care possible. MyChart messages sent after 11:00 AM on Friday will not be received by the provider until Monday morning.    Lab and Test Results: You will receive your lab and test results on MyChart as soon as they are completed and results have been sent by the lab or testing facility. Due to this service, you will receive your results BEFORE your provider.  I review lab and tests results each morning prior to seeing patients. Some results require collaboration with other providers to ensure you are receiving the most appropriate care. For this reason, we ask that you please allow a minimum of 3-5 business days from the time the ALL results have been received for your provider to receive and review lab and test results and contact you about these.  Most lab and test result comments from the provider will be sent through [place]. Your provider may recommend changes to the plan of care, follow-up visits, repeat testing, ask questions, or request an office visit to discuss these results. You may reply directly to this message or call the office at [phone] to provide information for the provider or set up an appointment. In some instances, you will be called with test results and recommendations. Please let us know if this is preferred and we will make note of this in your chart to provide this for you.    If you have not heard a response to your lab or test results in 5 business days from all results returning to [place], please call the office to let us know. We ask that you please avoid calling prior to this time unless there is an emergent concern. Due to high call volumes, this can delay the resulting process.  After Hours: For all non-emergency after hours needs, please call the office at [phone] and select the option to reach the on-call provider  service. On-call services are shared between multiple [place] offices and therefore it will not be possible to speak directly with your provider. On-call providers may provide medical advice and recommendations, but are unable to provide refills for maintenance medications.  For all emergency or urgent medical needs after normal business hours, we recommend that you seek care at the closest Urgent Care or Emergency Department to ensure appropriate treatment in a timely manner.  [facility] has a 24 hour emergency room located on the ground floor for your convenience.   Urgent Concerns During the Business Day Providers are seeing patients from 8AM to [place] with a busy schedule and are most often not able to respond to non-urgent calls until the end of the day or the next business day. If you should have URGENT concerns during the day, please call and speak to the nurse or schedule a same day appointment so that we can address your concern without delay.   Thank you, again, for choosing me as your health care partner. I  appreciate your trust and look forward to learning more about you.   Worthy Keeler, DNP, AGNP-c ___________________________________________________________  Health Maintenance Recommendations Screening Testing Mammogram Every 1 -2 years based on history and risk factors Starting at age 28 Pap Smear Ages 21-39 every 3 years Ages 75-65 every 5 years with HPV testing More frequent testing may be required based on results and history Colon Cancer Screening Every 1-10 years based on test performed, risk factors, and history Starting at age 79 Bone Density Screening Every 2-10 years based on history Starting at age 62 for women Recommendations for men differ based on medication usage, history, and risk factors AAA Screening One time ultrasound Men 34-43 years old who have every smoked Lung Cancer Screening Low Dose Lung CT every 12 months Age 7-80  years with a 30 pack-year smoking history who still smoke or who have quit within the last 15 years  Screening Labs Routine  Labs: Complete Blood Count (CBC), Complete Metabolic Panel (CMP), Cholesterol (Lipid Panel) Every 6-12 months based on history and medications May be recommended more frequently based on current conditions or previous results Hemoglobin A1c Lab Every 3-12 months based on history and previous results Starting at age 27 or earlier with diagnosis of diabetes, high cholesterol, BMI >26, and/or risk factors Frequent monitoring for patients with diabetes to ensure blood sugar control Thyroid Panel (TSH w/ T3 & T4) Every 6 months based on history, symptoms, and risk factors May be repeated more often if on medication HIV One time testing for all patients 71 and older May be repeated more frequently for patients with increased risk factors or exposure Hepatitis C One time testing for all patients 2 and older May be repeated more frequently for patients with increased risk factors or exposure Gonorrhea, Chlamydia Every 12 months for all sexually active persons 13-24 years Additional monitoring may be recommended for those who are considered high risk or who have symptoms PSA Men 28-96 years old with risk factors Additional screening may be recommended from age 44-69 based on risk factors, symptoms, and history  Vaccine Recommendations Tetanus Booster All adults every 10 years Flu Vaccine All patients 6 months and older every year COVID Vaccine All patients 12 years and older Initial dosing with booster May recommend additional booster based on age and health history HPV Vaccine 2 doses all patients age 61-26 Dosing may be considered for patients over 26 Shingles Vaccine (Shingrix) 2 doses all adults 44 years and older Pneumonia (Pneumovax 23) All adults 42 years and older May recommend earlier dosing based on health history Pneumonia (Prevnar 69) All adults  107 years and older Dosed 1 year after Pneumovax 23  Additional Screening, Testing, and Vaccinations may be recommended on an individualized basis based on family history, health history, risk factors, and/or exposure.  __________________________________________________________  Diet Recommendations for All Patients  I recommend that all patients maintain a diet low in saturated fats, carbohydrates, and cholesterol. While this can be challenging at first, it is not impossible and small changes can make big differences.  Things to try: Decreasing the amount of soda, sweet tea, and/or juice to one or less per day and replace with water While water is always the first choice, if you do not like water you may consider adding a water additive without sugar to improve the taste other sugar free drinks Replace potatoes with a brightly colored vegetable at dinner Use healthy oils, such as canola oil or olive oil, instead of butter or hard margarine  Limit your bread intake to two pieces or less a day Replace regular pasta with low carb pasta options Bake, broil, or grill foods instead of frying Monitor portion sizes  Eat smaller, more frequent meals throughout the day instead of large meals  An important thing to remember is, if you love foods that are not great for your health, you don't have to give them up completely. Instead, allow these foods to be a reward when you have done well. Allowing yourself to still have special treats every once in a while is a nice way to tell yourself thank you for working hard to keep yourself healthy.   Also remember that every day is a new day. If you have a bad day and "fall off the wagon", you can still climb right back up and keep moving along on your journey!  We have resources available to help you!  Some websites that may be helpful include: www.http://carter.biz/  Www.VeryWellFit.com _____________________________________________________________  Activity  Recommendations for All Patients  I recommend that all adults get at least 20 minutes of moderate physical activity that elevates your heart rate at least 5 days out of the week.  Some examples include: Walking or jogging at a pace that allows you to carry on a conversation Cycling (stationary bike or outdoors) Water aerobics Yoga Weight lifting Dancing If physical limitations prevent you from putting stress on your joints, exercise in a pool or seated in a chair are excellent options.  Do determine your MAXIMUM heart rate for activity: YOUR AGE - 220 = MAX HeartRate   Remember! Do not push yourself too hard.  Start slowly and build up your pace, speed, weight, time in exercise, etc.  Allow your body to rest between exercise and get good sleep. You will need more water than normal when you are exerting yourself. Do not wait until you are thirsty to drink. Drink with a purpose of getting in at least 8, 8 ounce glasses of water a day plus more depending on how much you exercise and sweat.    If you begin to develop dizziness, chest pain, abdominal pain, jaw pain, shortness of breath, headache, vision changes, lightheadedness, or other concerning symptoms, stop the activity and allow your body to rest. If your symptoms are severe, seek emergency evaluation immediately. If your symptoms are concerning, but not severe, please let us know so that we can recommend further evaluation.

## 2021-06-29 ENCOUNTER — Other Ambulatory Visit (HOSPITAL_BASED_OUTPATIENT_CLINIC_OR_DEPARTMENT_OTHER): Payer: Self-pay | Admitting: Nurse Practitioner

## 2021-06-29 ENCOUNTER — Telehealth: Payer: BC Managed Care – PPO | Admitting: Physician Assistant

## 2021-06-29 DIAGNOSIS — J189 Pneumonia, unspecified organism: Secondary | ICD-10-CM

## 2021-06-29 DIAGNOSIS — R918 Other nonspecific abnormal finding of lung field: Secondary | ICD-10-CM

## 2021-06-29 DIAGNOSIS — R059 Cough, unspecified: Secondary | ICD-10-CM

## 2021-06-29 MED ORDER — LEVOFLOXACIN 500 MG PO TABS: 500.0000 mg | ORAL_TABLET | Freq: Every day | ORAL | 0 refills | Status: DC

## 2021-07-05 ENCOUNTER — Ambulatory Visit (HOSPITAL_BASED_OUTPATIENT_CLINIC_OR_DEPARTMENT_OTHER)
Admission: RE | Admit: 2021-07-05 | Discharge: 2021-07-05 | Disposition: A | Discharge status: Home / Self Care | Payer: BC Managed Care – PPO | Source: Ambulatory Visit | Attending: Nurse Practitioner | Admitting: Nurse Practitioner

## 2021-07-05 ENCOUNTER — Telehealth (HOSPITAL_BASED_OUTPATIENT_CLINIC_OR_DEPARTMENT_OTHER): Payer: Self-pay

## 2021-07-05 ENCOUNTER — Encounter (HOSPITAL_BASED_OUTPATIENT_CLINIC_OR_DEPARTMENT_OTHER): Payer: Self-pay | Admitting: Nurse Practitioner

## 2021-07-05 ENCOUNTER — Encounter (HOSPITAL_BASED_OUTPATIENT_CLINIC_OR_DEPARTMENT_OTHER): Payer: Self-pay

## 2021-07-05 ENCOUNTER — Other Ambulatory Visit (HOSPITAL_BASED_OUTPATIENT_CLINIC_OR_DEPARTMENT_OTHER): Payer: Self-pay

## 2021-07-05 ENCOUNTER — Other Ambulatory Visit (HOSPITAL_BASED_OUTPATIENT_CLINIC_OR_DEPARTMENT_OTHER): Payer: Self-pay | Admitting: Nurse Practitioner

## 2021-07-05 DIAGNOSIS — R052 Subacute cough: Secondary | ICD-10-CM | POA: Diagnosis present

## 2021-07-05 DIAGNOSIS — R071 Chest pain on breathing: Secondary | ICD-10-CM | POA: Diagnosis present

## 2021-07-05 DIAGNOSIS — J189 Pneumonia, unspecified organism: Secondary | ICD-10-CM | POA: Diagnosis present

## 2021-07-05 MED ORDER — LEVOFLOXACIN 500 MG PO TABS
500.0000 mg | ORAL_TABLET | Freq: Every day | ORAL | 0 refills | Status: DC
  Filled 2021-07-05: qty 7, 7d supply, fill #0

## 2021-07-05 NOTE — Progress Notes (Signed)
Patient notified provider of worsening symptoms despite escalation of antibiotic therapy. CT not scheduled until 07/24. Due to the change in patient status, will send STAT CT orders for [facility] imaging and continue levaquin for an additional 7 days.   Patient aware to seek emergency attention immediately if she begins to develop severe symptoms and not to wait for the outpatient imaging to be completed.

## 2021-07-05 NOTE — Telephone Encounter (Signed)
Patient requires a peer to peer review for her CT Chest CPT code 53646 she is scheduled for 3pm today. Call [phone] case ID # [id number]

## 2021-07-06 ENCOUNTER — Encounter (HOSPITAL_BASED_OUTPATIENT_CLINIC_OR_DEPARTMENT_OTHER): Payer: Self-pay | Admitting: Nurse Practitioner

## 2021-07-06 ENCOUNTER — Ambulatory Visit (INDEPENDENT_AMBULATORY_CARE_PROVIDER_SITE_OTHER): Payer: BC Managed Care – PPO | Admitting: Internal Medicine

## 2021-07-06 ENCOUNTER — Encounter: Payer: Self-pay | Admitting: Internal Medicine

## 2021-07-06 VITALS — BP 124/72 | HR 110 | Ht 68.0 in | Wt 267.0 lb

## 2021-07-06 DIAGNOSIS — K439 Ventral hernia without obstruction or gangrene: Secondary | ICD-10-CM | POA: Insufficient documentation

## 2021-07-06 DIAGNOSIS — J984 Other disorders of lung: Secondary | ICD-10-CM | POA: Diagnosis not present

## 2021-07-06 DIAGNOSIS — F32A Depression, unspecified: Secondary | ICD-10-CM | POA: Insufficient documentation

## 2021-07-06 DIAGNOSIS — R059 Cough, unspecified: Secondary | ICD-10-CM

## 2021-07-06 DIAGNOSIS — R918 Other nonspecific abnormal finding of lung field: Secondary | ICD-10-CM

## 2021-07-06 DIAGNOSIS — J189 Pneumonia, unspecified organism: Secondary | ICD-10-CM | POA: Diagnosis not present

## 2021-07-06 DIAGNOSIS — L732 Hidradenitis suppurativa: Secondary | ICD-10-CM | POA: Insufficient documentation

## 2021-07-06 NOTE — Patient Instructions (Addendum)
ICD-10-CM   1. Cavitary pneumonia  J18.9    J98.4      Somewhat slow to resolve process since onset 05/17/21 but still within the "Bell curve" of process like this  Possibly you got a bad bacterial pneumnia for which no antibiotics first week and your body had to deal with it by self and now trying to slowly resolve  Plan  - continue total 14 days of levquain; today is day 8 - check blood IgE, Rast Allergy panel - check ANA, DS-DNA, RF, CCP, ANCA, GBM, ESR , ACE - check Quantiferon Gold blood test, HIV - repeat CXR in  2 - 3 weeks (do not do it now)  Followup   - see APP in 2 - 3weeks with results of blood work and CXR

## 2021-07-06 NOTE — Assessment & Plan Note (Deleted)
Initial onset of pneumonia mid May with antibiotic therapy including doxycycline, Augmentin, and ciprofloxacin.  She has completed all 3 of these antibiotics at this time.  She has also had 1 round of 40 mg prednisone 5-day burst. She continues to have symptoms consistent with pneumonia.  Rhonchi and rails are audible

## 2021-07-06 NOTE — Assessment & Plan Note (Signed)
Initial onset of pneumonia mid May with antibiotic therapy over 3-week.  Including doxycycline, Augmentin, and ciprofloxacin.  She has completed all 3 of these antibiotics at this time.  She has also had 1 round of 40 mg prednisone 5-day burst. Most recent chest x-ray on 5/29.  At that time pneumonia appeared to be improving. Today she continues to have symptoms consistent with pneumonia.  Rhonchi and rails are audible and she is experiencing a productive cough.  She is afebrile at this time however in general continues to feel unwell. Given the patient's ongoing symptoms I do feel it is appropriate to repeat chest x-ray to ensure that pneumonia has not worsened.  Will wait for chest x-ray results to return to determine if antibiotic therapy is warranted at this time. Recommend continuation of Mucinex, Tessalon, fluticasone, and albuterol. We will make changes to plan of care based on findings of x-ray.

## 2021-07-06 NOTE — Assessment & Plan Note (Signed)
Chronic.  Concerns with weight gain with sertraline.  Patient would like to switch to fluoxetine to see if this helps reduce this side effect.  I do feel that this is acceptable to trial the transition.  She is currently on the lowest dose of sertraline therefore no taper is required.  We will plan to start low-dose fluoxetine tomorrow morning and we can follow-up in the next 3 to 4 weeks to see how she is doing on the new treatment.  No SI/HI today.

## 2021-07-06 NOTE — Progress Notes (Signed)
OV 07/06/2021  Subjective:  Patient ID: Emily Sharp, female , DOB: Nov 24, 1980 , age 41 y.o. , MRN: [id number] , ADDRESS: [address] [place] [id number] PCP Early, Coralee Pesa, NP Patient Care Team: Early, Coralee Pesa, NP as PCP - General (Nurse Practitioner)  This Provider for this visit: Treatment Team:  Attending Provider: Brand Males, MD    07/06/2021 -consult for cavitary pneumonia left lower lobe Chief Complaint  Patient presents with   Consult    Pt states she was diagnosed with pna 6 weeks ago and states that she has been off and on abx and still having problems. Also states she had pleural effusion seen on imaging. Pt does have complaints of cough, SOB, and chest discomfort.     HPI Emily Sharp 41 y.o. -new consult evaluation.  She is a Pharmacist, hospital at [employer].  On May 15, 2021 she went on a school trip with 200 kids to a baseball field trip.  She believes a lot of kids was sick with respiratory infection.  Then 1 or 2 days later on May 17, 2021 she believes she got a viral infection respiratory.  She describes high fevers chills with shaking chills.  This lasted a whole week.  Record review shows May 22, 2021 she went to the ED within our health system.  She was tachycardic with temperature of 100.5.  COVID flu and UTI was negative.  She was given IV fluids.  Lactic acid was normal.  According to the EDP chest x-ray was clear.  However I am not able to visualize this particular chest x-ray.  3 days later May 25, 2021 she represented to the ED because of ongoing symptoms and this time had developed left infrascapular pain.  She was given doxycycline and another antibiotic because of both ear infection and pneumonia.  Patient was given IV fluids again.  EDP exam also showed fluid around the tympanic membrane.  Otitis media was diagnosed.  She also had rales on her exam.  10 days of doxycycline was given.  Represented to the emergency department Jun 04, 2021 because of ongoing respiratory symptoms and left infrascapular discomfort.  Pulse ox was 93% on room air.  Afebrile normal blood pressure.  Then examined by Dr. Campbell Stall as an outpatient 06/04/2021.  Chest x-ray although improved from my 519/2023 still showed residual persistent left lower lobe opacity with concern of a small pleural effusion.  She is started on Levaquin.  At this point in time she is finished day 8 of day 14 of Levaquin.  She was also having some green sputum at this time.  Overall after starting Levaquin she feels better but she still has left infrascapular dullness.  Feeling of tiredness.  The sputum is now clear.  She does not feel back to baseline at all compared to early May 2023.  She saw primary care physician on 06/28/2021.  Chest x-ray was not done.  But instead a CT scan of the chest was ordered.  This was just done yesterday.  X-ray was 1 week into her Levaquin treatment.  It has dense left lower lobe consolidation with central cavitation with a thick wall.  In this area on exam there are crackles.  She denied any aspiration.  She denies any smoking.  She denies any loss of teeth.  She denies any poor dentition.  She denies any autoimmune disease or vasculitis.  She denies any tuberculosis no alcohol intake.  No travel.  No known tuberculosis.  No fever no chills currently.  She believes she has gestational diabetes and allergies to the season not otherwise specified. CT Chest data  CT Chest Wo Contrast  Result Date: 07/05/2021 CLINICAL DATA:  A 41 year old female presents for evaluation of respiratory illness, nondiagnostic chest x-ray with cough. EXAM: CT CHEST WITHOUT CONTRAST TECHNIQUE: Multidetector CT imaging of the chest was performed following the standard protocol without IV contrast. RADIATION DOSE REDUCTION: This exam was performed according to the departmental dose-optimization program which includes automated exposure control, adjustment of the mA and/or  kV according to patient size and/or use of iterative reconstruction technique. COMPARISON:  May 25, 2021 and prior chest x-rays. FINDINGS: Cardiovascular: Normal caliber of the thoracic aorta. Normal heart size without pericardial effusion. Limited assessment of cardiovascular structures given lack of intravenous contrast. Mediastinum/Nodes: No thoracic inlet, axillary or supraclavicular lymphadenopathy. No mediastinal adenopathy. No gross hilar adenopathy though there are borderline enlarged lymph nodes in the LEFT hilum up to 11 mm (image 61/2) Lungs/Pleura: Small LEFT-sided pleural effusion. Irregular cavitary thick-walled area in the LEFT lung base measuring 5.4 x 3.7 cm. Adjacent micro nodules and satellite nodules are present throughout the LEFT lower lobe. For instance on image 76/4 there is a 7 mm nodule, this represents the largest peripheral nodule in the LEFT lower lobe. RIGHT lung is clear.  Airways are patent. Upper Abdomen: Marked hepatic steatosis. Gallbladder is collapsed. Liver is incompletely imaged. No peripancreatic stranding. Spleen is unremarkable. Musculoskeletal: No chest wall abnormality. No acute bone finding. Spinal degenerative changes. IMPRESSION: 1. Irregular cavitary thick-walled area in the LEFT lung base measuring 5.4 x 3.7 cm. Adjacent micro nodules and satellite nodules are present throughout the LEFT lower lobe. Findings are concerning for a cavitary pneumonia. Given persistence and thick-walled appearance would suggest chest follow-up to ensure resolution and exclude underlying mass or neoplasm. Given persistence would also suggest pulmonary consultation for further assessment as in addition more aggressive or atypical infectious agents are considered based on appearance. 2. Small LEFT parapneumonic effusion. 3. Borderline enlarged LEFT hilar lymph nodes, likely reactive. Attention on follow-up. 4. Marked hepatic steatosis. Electronically Signed   By: Zetta Bills M.D.   On:  07/05/2021 15:34      PFT      No data to display             has a past medical history of Anxiety and depression, Asthma, Encounter for assisted reproductive fertility procedure cycle, Encounter for fertility testing, Female infertility associated with anovulation, Gestational diabetes, PCOS (polycystic ovarian syndrome), and Polyp of corpus uteri.   reports that she has never smoked. She has never used smokeless tobacco.  Past Surgical History:  Procedure Laterality Date   BREAST REDUCTION SURGERY     CESAREAN SECTION MULTI-GESTATIONAL N/A 06/10/2017   Procedure: PRIMARY CESAREAN SECTION MULTI-GESTATIONAL;  Surgeon: Everlene Farrier, MD;  Location: [facility];  Service: Obstetrics;  Laterality: N/A;  [name], RNFA   DILATION AND CURETTAGE OF UTERUS     INCISIONAL HERNIA REPAIR     OVARIAN CYST REMOVAL     WISDOM TOOTH EXTRACTION      Allergies  Allergen Reactions   Sulfa Antibiotics Hives    HIVES, RASH   Ancef [Cefazolin] Rash and Cough    Immunization History  Administered Date(s) Administered   Influenza Split 09/03/2013   Influenza,inj,Quad PF,6+ Mos 11/13/2014, 12/20/2015   PFIZER Comirnaty(Gray Top)Covid-19 Tri-Sucrose Vaccine 08/15/2020   Pneumococcal Polysaccharide-23 04/13/2013    Family History  Problem Relation Age of Onset   Skin cancer Mother    Diabetes Father    Peripheral vascular disease Father    Bladder Cancer Father    Hypertension Father    AAA (abdominal aortic aneurysm) Father    Skin cancer Father    Skin cancer Maternal Grandfather      Current Outpatient Medications:    albuterol (VENTOLIN HFA) 108 (90 Base) MCG/ACT inhaler, Inhale 2 puffs into the lungs every 6 (six) hours as needed for wheezing or shortness of breath (Cough)., Disp: 18 g, Rfl: 0   benzonatate (TESSALON) 100 MG capsule, Take 1 capsule (100 mg total) by mouth 3 (three) times daily as needed for cough., Disp: 30 capsule, Rfl: 0   [START ON 07/28/2021]  FLUoxetine (PROZAC) 40 MG capsule, Take 1 capsule (40 mg total) by mouth daily. Start after finishing the 57m prescription., Disp: 90 capsule, Rfl: 3   fluticasone (FLONASE) 50 MCG/ACT nasal spray, Place 1 spray into both nostrils daily. Begin by using 2 sprays in each nare daily for 3 to 5 days, then decrease to 1 spray in each nare daily., Disp: 32 mL, Rfl: 1   levofloxacin (LEVAQUIN) 500 MG tablet, Take 1 tablet (500 mg total) by mouth daily for 7 days., Disp: 7 tablet, Rfl: 0      Objective:   Vitals:   07/06/21 1427  BP: 124/72  Pulse: (!) 110  SpO2: 97%  Weight: 267 lb (121.1 kg)  Height: 5' 8"  (1.727 m)    Estimated body mass index is 40.6 kg/m as calculated from the following:   Height as of this encounter: 5' 8"  (1.727 m).   Weight as of this encounter: 267 lb (121.1 kg).  @WEIGHTCHANGE @  [facility]  07/06/21 1427  Weight: 267 lb (121.1 kg)     Physical ExamGeneral: No distress. Looks well Neuro: Alert and Oriented x 3. GCS 15. Speech normal Psych: Pleasant Resp:  Barrel Chest - no.  Wheeze - no, Crackles - LLL CRACKLES, No overt respiratory distress CVS: Normal heart sounds. Murmurs - no Ext: Stigmata of Connective Tissue Disease - no HEENT: Normal upper airway. PEERL +. No post nasal drip        Assessment:       ICD-10-CM   1. Cavitary pneumonia  J18.9 IgE   J98.4 Resp Allergy Profile Regn2DC DE [facility]    ANA    Anti-DNA antibody, double-stranded    Rheumatoid factor    Cyclic citrul peptide antibody, IgG    ANCA screen with reflex titer    Sedimentation rate    Angiotensin converting enzyme    Glomerular Membrane Antibodies    QuantiFERON-TB Gold Plus    HIV antibody (with reflex)    DG Chest 2 View         Plan:     Patient Instructions     ICD-10-CM   1. Cavitary pneumonia  J18.9    J98.4      Somewhat slow to resolve process since onset 05/17/21 but still within the "Bell curve" of process like this  Possibly you got a bad  bacterial pneumnia for which no antibiotics first week and your body had to deal with it by self and now trying to slowly resolve  Plan  - continue total 14 days of levquain; today is day 8 - check blood IgE, Rast Allergy panel - check ANA, DS-DNA, RF, CCP, ANCA, GBM, ESR , ACE - check Quantiferon Gold blood test,  HIV - repeat CXR in  2 - 3 weeks (do not do it now)  Followup   - see APP in 2 - 3weeks with results of blood work and CXR    SIGNATURE    Dr. Brand Males, M.D., F.C.C.P,  Pulmonary and Critical Care Medicine Staff Physician, [place] Director - Interstitial Lung Disease  Program  Pulmonary [place] [facility] [place], [place], [address]  Pager: [phone], If no answer or between  15:00h - 7:00h: call [phone] Telephone: [phone]  3:17 PM 07/06/2021

## 2021-07-06 NOTE — Assessment & Plan Note (Signed)
Significant palpable herniation present to the abdomen just right of the midline.  With increased intrathoracic pressure the herniation does become significantly larger.  Given the patient's increased coughing over the past month I do feel that this has caused a worsening of this condition and at this time warrants surgical evaluation for consideration for repair.  We will send referral for surgery.  Patient is not having any alarm symptoms at this time.  Discussed symptoms that would warrant immediate repeat evaluation.  Patient expressed understanding.

## 2021-07-06 NOTE — Addendum Note (Signed)
Addended by: Wyvonne Lenz on: 07/06/2021 03:35 PM   Modules accepted: Orders

## 2021-07-07 LAB — HIV ANTIBODY (ROUTINE TESTING W REFLEX): HIV 1&2 Ab, 4th Generation: NONREACTIVE

## 2021-07-07 LAB — SEDIMENTATION RATE: Sed Rate: 25 mm/h — ABNORMAL HIGH (ref 0–20)

## 2021-07-08 ENCOUNTER — Telehealth: Payer: BC Managed Care – PPO | Admitting: Nurse Practitioner

## 2021-07-08 DIAGNOSIS — R052 Subacute cough: Secondary | ICD-10-CM | POA: Diagnosis not present

## 2021-07-08 MED ORDER — LEVOFLOXACIN 500 MG PO TABS: 500.0000 mg | ORAL_TABLET | Freq: Every day | ORAL | 0 refills | Status: AC

## 2021-07-08 NOTE — Progress Notes (Signed)
We are sorry that you are not feeling well.  Here is how we plan to help!  Based on your presentation I believe you most likely have A cough due to bacteria.  When patients have a fever and a productive cough with a change in color or increased sputum production, we are concerned about bacterial bronchitis.  If left untreated it can progress to pneumonia.  If your symptoms do not improve with your treatment plan it is important that you contact your provider.   I have prescribed Levofloxacin 500 mg daily for 7 days    In addition you may use A non-prescription cough medication called Mucinex DM: take 2 tablets every 12 hours.   From your responses in the eVisit questionnaire you describe inflammation in the upper respiratory tract which is causing a significant cough.  This is commonly called Bronchitis and has four common causes:   Allergies Viral Infections Acid Reflux Bacterial Infection Allergies, viruses and acid reflux are treated by controlling symptoms or eliminating the cause. An example might be a cough caused by taking certain blood pressure medications. You stop the cough by changing the medication. Another example might be a cough caused by acid reflux. Controlling the reflux helps control the cough.  USE OF BRONCHODILATOR ("RESCUE") INHALERS: There is a risk from using your bronchodilator too frequently.  The risk is that over-reliance on a medication which only relaxes the muscles surrounding the breathing tubes can reduce the effectiveness of medications prescribed to reduce swelling and congestion of the tubes themselves.  Although you feel brief relief from the bronchodilator inhaler, your asthma may actually be worsening with the tubes becoming more swollen and filled with mucus.  This can delay other crucial treatments, such as oral steroid medications. If you need to use a bronchodilator inhaler daily, several times per day, you should discuss this with your provider.  There are  probably better treatments that could be used to keep your asthma under control.     HOME CARE Only take medications as instructed by your medical team. Complete the entire course of an antibiotic. Drink plenty of fluids and get plenty of rest. Avoid close contacts especially the very young and the elderly Cover your mouth if you cough or cough into your sleeve. Always remember to wash your hands A steam or ultrasonic humidifier can help congestion.   GET HELP RIGHT AWAY IF: You develop worsening fever. You become short of breath You cough up blood. Your symptoms persist after you have completed your treatment plan MAKE SURE YOU  Understand these instructions. Will watch your condition. Will get help right away if you are not doing well or get worse.    Thank you for choosing an e-visit.  Your e-visit answers were reviewed by a board certified advanced clinical practitioner to complete your personal care plan. Depending upon the condition, your plan could have included both over the counter or prescription medications.  Please review your pharmacy choice. Make sure the pharmacy is open so you can pick up prescription now. If there is a problem, you may contact your provider through [employer] and have the prescription routed to another pharmacy.  Your safety is important to [place]. If you have drug allergies check your prescription carefully.   For the next 24 hours you can use MyChart to ask questions about today's visit, request a non-urgent call back, or ask for a work or school excuse. You will get an email in the next two days  asking about your experience. I hope that your e-visit has been valuable and will speed your recovery.  5-10 minutes spent reviewing and documenting in chart.

## 2021-07-09 LAB — RESPIRATORY ALLERGY PROFILE REGION II ~~LOC~~
Allergen, A. alternata, m6: <0.1 kU/L
Allergen, Cedar tree, t12: <0.1 kU/L
Allergen, Comm Silver Birch, t9: <0.1 kU/L
Allergen, Cottonwood, t14: <0.1 kU/L
Allergen, D pternoyssinus,d7: 1.79 kU/L — ABNORMAL HIGH
Allergen, Mouse Urine Protein, e78: <0.1 kU/L
Allergen, Mulberry, t76: <0.1 kU/L
Allergen, Oak,t7: <0.1 kU/L
Allergen, P. notatum, m1: <0.1 kU/L
Aspergillus fumigatus, m3: <0.1 kU/L
Bermuda Grass: 0.63 kU/L — ABNORMAL HIGH
Box Elder IgE: 0.52 kU/L — ABNORMAL HIGH
CLADOSPORIUM HERBARUM (M2) IGE: <0.1 kU/L
COMMON RAGWEED (SHORT) (W1) IGE: <0.1 kU/L
Cat Dander: >100 kU/L — ABNORMAL HIGH
Class: 0
Class: 0
Class: 0
Class: 0
Class: 0
Class: 0
Class: 0
Class: 0
Class: 0
Class: 0
Class: 0
Class: 0
Class: 0
Class: 0
Class: 0
Class: 0
Class: 1
Class: 1
Class: 2
Class: 2
Class: 2
Class: 2
Class: 3
Class: 6
Cockroach: <0.1 kU/L
D. farinae: 2.32 kU/L — ABNORMAL HIGH
Dog Dander: 3.74 kU/L — ABNORMAL HIGH
Elm IgE: <0.1 kU/L
IgE (Immunoglobulin E), Serum: 209 kU/L — ABNORMAL HIGH (ref <=114)
Johnson Grass: 1.16 kU/L — ABNORMAL HIGH
Pecan/Hickory Tree IgE: <0.1 kU/L
Rough Pigweed  IgE: <0.1 kU/L
Sheep Sorrel IgE: <0.1 kU/L
Timothy Grass: 3.19 kU/L — ABNORMAL HIGH

## 2021-07-09 LAB — GLOMERULAR BASEMENT MEMBRANE ANTIBODIES: GBM Ab: <1 AI (ref <1.0)

## 2021-07-09 LAB — ANTI-NUCLEAR AB-TITER (ANA TITER)
ANA TITER: 1:40 {titer} — ABNORMAL HIGH
ANA Titer 1: 1:80 {titer} — ABNORMAL HIGH

## 2021-07-09 LAB — ANTI-DNA ANTIBODY, DOUBLE-STRANDED: ds DNA Ab: 1 IU/mL

## 2021-07-09 LAB — CYCLIC CITRUL PEPTIDE ANTIBODY, IGG: Cyclic Citrullin Peptide Ab: <16 UNITS

## 2021-07-09 LAB — ANA: Anti Nuclear Antibody (ANA): POSITIVE — AB

## 2021-07-09 LAB — IGE: IgE (Immunoglobulin E), Serum: 200 kU/L — ABNORMAL HIGH (ref <=114)

## 2021-07-09 LAB — ANGIOTENSIN CONVERTING ENZYME: Angiotensin-Converting Enzyme: 25 U/L (ref 9–67)

## 2021-07-09 LAB — INTERPRETATION:

## 2021-07-09 LAB — ANCA SCREEN W REFLEX TITER: ANCA SCREEN: NEGATIVE

## 2021-07-09 LAB — RHEUMATOID FACTOR: Rheumatoid fact SerPl-aCnc: <14 IU/mL (ref <14)

## 2021-07-10 LAB — QUANTIFERON-TB GOLD PLUS
Mitogen-NIL: 8.83 IU/mL
NIL: 0.03 IU/mL
QuantiFERON-TB Gold Plus: NEGATIVE
TB1-NIL: 0 IU/mL
TB2-NIL: 0.02 IU/mL

## 2021-07-11 ENCOUNTER — Telehealth: Payer: Self-pay | Admitting: Internal Medicine

## 2021-07-11 NOTE — Telephone Encounter (Signed)
Blood allergy test positive for grass and dust mite. Boderline trace aNA. Can discuss results when sees Katie Cobb. Otherwise negative

## 2021-07-12 NOTE — Telephone Encounter (Signed)
Called and spoke with pt letting her know the results of bloodwork and she verbalized understanding. Nothing further needed. 

## 2021-07-20 ENCOUNTER — Ambulatory Visit (INDEPENDENT_AMBULATORY_CARE_PROVIDER_SITE_OTHER): Payer: BC Managed Care – PPO

## 2021-07-20 ENCOUNTER — Ambulatory Visit: Payer: BC Managed Care – PPO | Admitting: Nurse Practitioner

## 2021-07-20 ENCOUNTER — Encounter: Payer: Self-pay | Admitting: Nurse Practitioner

## 2021-07-20 VITALS — BP 110/78 | HR 91 | Temp 98.2°F | Ht 68.0 in | Wt 269.2 lb

## 2021-07-20 DIAGNOSIS — J984 Other disorders of lung: Secondary | ICD-10-CM

## 2021-07-20 DIAGNOSIS — R0609 Other forms of dyspnea: Secondary | ICD-10-CM | POA: Insufficient documentation

## 2021-07-20 DIAGNOSIS — J189 Pneumonia, unspecified organism: Secondary | ICD-10-CM

## 2021-07-20 DIAGNOSIS — J3081 Allergic rhinitis due to animal (cat) (dog) hair and dander: Secondary | ICD-10-CM

## 2021-07-20 DIAGNOSIS — J309 Allergic rhinitis, unspecified: Secondary | ICD-10-CM | POA: Insufficient documentation

## 2021-07-20 LAB — CBC WITH DIFFERENTIAL/PLATELET
Basophils Absolute: 0 10*3/uL (ref 0.0–0.1)
Basophils Relative: 0.7 % (ref 0.0–3.0)
Eosinophils Absolute: 0.3 10*3/uL (ref 0.0–0.7)
Eosinophils Relative: 4.3 % (ref 0.0–5.0)
HCT: 41.2 % (ref 36.0–46.0)
Hemoglobin: 13.5 g/dL (ref 12.0–15.0)
Lymphocytes Relative: 34.8 % (ref 12.0–46.0)
Lymphs Abs: 2.2 10*3/uL (ref 0.7–4.0)
MCHC: 32.7 g/dL (ref 30.0–36.0)
MCV: 88.6 fl (ref 78.0–100.0)
Monocytes Absolute: 0.4 10*3/uL (ref 0.1–1.0)
Monocytes Relative: 5.8 % (ref 3.0–12.0)
Neutro Abs: 3.4 10*3/uL (ref 1.4–7.7)
Neutrophils Relative %: 54.4 % (ref 43.0–77.0)
Platelets: 213 10*3/uL (ref 150.0–400.0)
RBC: 4.64 Mil/uL (ref 3.87–5.11)
RDW: 15 % (ref 11.5–15.5)
WBC: 6.3 10*3/uL (ref 4.0–10.5)

## 2021-07-20 MED ORDER — MONTELUKAST SODIUM 10 MG PO TABS: 10.0000 mg | ORAL_TABLET | Freq: Every day | ORAL | 5 refills | Status: DC

## 2021-07-20 NOTE — Assessment & Plan Note (Signed)
Positive RAST to multiple environmental triggers, including a very high positive to cat dander. She has one cat at home, previously with three. She does have allergy symptoms which are mostly controlled with Xyzal but occasionally has some breakthrough. We will add singulair. We are also going to send referral to allergist as she is interested in allergy shots.

## 2021-07-20 NOTE — Progress Notes (Signed)
@Patient  ID: , female    DOB: March 07, 1980, 41 y.o.   MRN: [id number]  Chief Complaint  Patient presents with   Follow-up    Follow up. Patient says she is doing a lot better. She has no complaints.     Referring provider: [id number], NP  HPI: 41 year old female, never smoker followed for cavitary pneumonia.  She is a patient of Dr. 41 and last seen in office 07/06/2021.  Past medical history significant for sleep apnea, hydradenitis suppurativa, anxiety and depression.  In May 2023, she went on a school field trip with 200 kids to baseball game.  She believes that a lot of the kids were sick with respiratory infections.  She then developed similar symptoms 1 to 2 days later on May 17, 2021.  She had high fevers, chills which lasted a whole week.  She went to the ED during this timeframe and noted to be tachycardic with temperature of 100.5.  COVID, flu and UTI were all negative.  Lactic acid was normal.  Chest x-ray did not show any acute process.  She was given IV fluids and discharged home.    She then went back to urgent care on May 25, 2021 due to ongoing symptoms and this time she developed left infrascapular pain.  She was given doxycycline and augmentin because of bilateral ear infection and concern for pneumonia.  She was also treated with IV fluids again.  She was discharged home. She then reached out to [facility] provider on 5/22 d/t ongoing SOB and she was treated with prednisone burst.  She represented to urgent care on Jun 04, 2021 because of ongoing respiratory symptoms and persistent infrascapular discomfort; she was advised to go to the ED for further evaluation.  CXR showed persistent but improving pneumonia in LLL. Suspected that her leukocytosis was related to recent steroid use. She was treated with ciprofloxacin and discharged home.   She then had a televisit with UC office 06/29/2021. She had ongoing cough and shortness of breath. She had was treated  with extended Levaquin course and advised to follow up with PCP, who ordered CT of the chest. Her CT was completed on 6/29 which showed an irregular cavitary thick-walled area in the left lung base measuring 5.4x3.7cm with adjacent micro nodules and satellite nodules throughout the LLL, and a small left parapneumonic effusion.   TEST/EVENTS:  07/05/2021 CT chest without contrast: No gross LAD although there are borderline enlarged lymph nodes in the left hilum up to 11 mm.  There is a small left-sided pleural effusion, parapneumonic.  There is an irregular cavitary thick-walled area in the left lung measuring 5.4 x 3.7 cm.  There are adjacent micronodules and satellite nodules are present throughout the left lower lobe, largest of which is 7 mm.  Right lung is clear. 07/06/2021 RAST: positives to dust, dog, box elder, multiple grasses. Significant allergy to cat dander (>100)  07/06/2021: OV with Dr. 07/08/2021. Feeling better after starting on Levaquin but still having some infrascapular dullness and fatigue. Somewhat slow to resolve but within the [place]. Continue 14 days total of levaquin; on day 8. Check autoimmune labs - low positive ANA and positive RAST, otherwise, negative. Repeat CXR in 2-3 weeks.  07/20/2021: Today - follow up Patient presents today for follow up. She has had a rough few months dealing with an persistent pneumonia after viral illness. She has completed levaquin, about 10 days ago, and finally feels like she is getting back to  her normal. She still has an occasional cough, which is drastically improved and non-productive. She no longer is having increased shortness of breath from her baseline. She's only required albuterol once in the last week, which is normal for her from a respiratory standpoint. She usually has to use her albuterol daily in the spring and fall. Thinks she probably has some mild asthma at baseline. Has never had formal testing or been on maintenance inhaler. She  denies any fevers, chills, night sweats, weight loss, hemoptysis.   Allergies  Allergen Reactions   Sulfa Antibiotics Hives    HIVES, RASH   Ancef [Cefazolin] Rash and Cough    Immunization History  Administered Date(s) Administered   Influenza Split 09/03/2013   Influenza,inj,Quad PF,6+ Mos 11/13/2014, 12/20/2015   PFIZER Comirnaty(Gray Top)Covid-19 Tri-Sucrose Vaccine 08/15/2020   Pneumococcal Polysaccharide-23 04/13/2013    Past Medical History:  Diagnosis Date   Abnormal glucose tolerance test (GTT) during pregnancy, antepartum 04/25/2017   Anxiety and depression    Asthma    Bradycardia 05/09/2016   Dichorionic diamniotic twin pregnancy, antepartum 05/06/2017   Encounter for assisted reproductive fertility procedure cycle    Encounter for fertility testing    Female infertility associated with anovulation    Gestational diabetes    Gestational diabetes 06/10/2017   H/O cesarean section 06/10/2017   Multiple gestation 06/10/2017   PCOS (polycystic ovarian syndrome)    PIH (pregnancy induced hypertension) 06/10/2017   Polyp of corpus uteri     Tobacco History: Social History   Tobacco Use  Smoking Status Never  Smokeless Tobacco Never   Counseling given: Not Answered   Outpatient Medications Prior to Visit  Medication Sig Dispense Refill   albuterol (VENTOLIN HFA) 108 (90 Base) MCG/ACT inhaler Inhale 2 puffs into the lungs every 6 (six) hours as needed for wheezing or shortness of breath (Cough). 18 g 0   [START ON 07/28/2021] FLUoxetine (PROZAC) 40 MG capsule Take 1 capsule (40 mg total) by mouth daily. Start after finishing the 20mg  prescription. 90 capsule 3   fluticasone (FLONASE) 50 MCG/ACT nasal spray Place 1 spray into both nostrils daily. Begin by using 2 sprays in each nare daily for 3 to 5 days, then decrease to 1 spray in each nare daily. 32 mL 1   benzonatate (TESSALON) 100 MG capsule Take 1 capsule (100 mg total) by mouth 3 (three) times daily as needed for  cough. (Patient not taking: Reported on 07/20/2021) 30 capsule 0   No facility-administered medications prior to visit.     Review of Systems:   Constitutional: No weight loss or gain, night sweats, fevers, chills, fatigue, or lassitude. HEENT: No headaches, difficulty swallowing, tooth/dental problems, or sore throat, ear ache. +nasal congestion, sneezing, itchy eyes (occasional; usually controlled with Xyzal) CV:  No chest pain, orthopnea, PND, swelling in lower extremities, anasarca, dizziness, palpitations, syncope Resp: +shortness of breath with exertion (rare; baseline); minimal dry cough (improving). No excess mucus or change in color of mucus.  No hemoptysis. No wheezing.  No chest wall deformity GI:  No heartburn, indigestion, abdominal pain, nausea, vomiting, diarrhea, change in bowel habits, loss of appetite, bloody stools.  Skin: No rash, lesions, ulcerations MSK:  No joint pain or swelling.  No decreased range of motion.  No back pain. Neuro: No dizziness or lightheadedness.  Psych: No depression or anxiety. Mood stable.     Physical Exam:  BP 110/78 (BP Location: Right Arm, Patient Position: Sitting, Cuff Size: Large)   Pulse 91  Temp 98.2 F (36.8 C) (Oral)   Ht 5\' 8"  (1.727 m)   Wt 269 lb 3.2 oz (122.1 kg)   SpO2 98%   BMI 40.93 kg/m   GEN: Pleasant, interactive, well-appearing; obese; in no acute distress. HEENT:  Normocephalic and atraumatic. PERRLA. Sclera white. Nasal turbinates pink, moist and patent bilaterally. No rhinorrhea present. Oropharynx pink and moist, without exudate or edema. No lesions, ulcerations, or postnasal drip.  NECK:  Supple w/ fair ROM. No JVD present. Normal carotid impulses w/o bruits. Thyroid symmetrical with no goiter or nodules palpated. No lymphadenopathy.   CV: RRR, no m/r/g, no peripheral edema. Pulses intact, +2 bilaterally. No cyanosis, pallor or clubbing. PULMONARY:  Unlabored, regular breathing. Diminished bases bilaterally  otherwise clear bilaterally A&P w/o wheezes/rales/rhonchi. No accessory muscle use. No dullness to percussion. GI: BS present and normoactive. Soft, non-tender to palpation. No organomegaly or masses detected. No CVA tenderness. MSK: No erythema, warmth or tenderness. Cap refil <2 sec all extrem. No deformities or joint swelling noted.  Neuro: A/Ox3. No focal deficits noted.   Skin: Warm, no lesions or rashe Psych: Normal affect and behavior. Judgement and thought content appropriate.     Lab Results:  CBC    Component Value Date/Time   WBC 6.3 07/20/2021 1012   RBC 4.64 07/20/2021 1012   HGB 13.5 07/20/2021 1012   HCT 41.2 07/20/2021 1012   PLT 213.0 07/20/2021 1012   MCV 88.6 07/20/2021 1012   MCH 28.1 06/04/2021 1712   MCHC 32.7 07/20/2021 1012   RDW 15.0 07/20/2021 1012   LYMPHSABS 2.2 07/20/2021 1012   MONOABS 0.4 07/20/2021 1012   EOSABS 0.3 07/20/2021 1012   BASOSABS 0.0 07/20/2021 1012    BMET    Component Value Date/Time   NA 136 06/04/2021 1722   K 4.2 06/04/2021 1722   CL 102 06/04/2021 1722   CO2 24 06/04/2021 1722   GLUCOSE 97 06/04/2021 1722   BUN 24 (H) 06/04/2021 1722   CREATININE 0.87 06/04/2021 1722   CALCIUM 9.4 06/04/2021 1722   GFRNONAA >60 06/04/2021 1722   GFRAA >60 06/12/2017 0609    BNP No results found for: "BNP"   Imaging:  DG Chest 2 View  Result Date: 07/20/2021 CLINICAL DATA:  Provided history: Cavitary pneumonia. EXAM: CHEST - 2 VIEW COMPARISON:  Chest CT 07/05/2021. Prior chest radiograph 06/28/2021 and earlier. FINDINGS: Heart size within normal limits. Persistent although improved opacity within the left lung base. No appreciable airspace consolidation within the right lung. No evidence of pleural effusion or pneumothorax. No acute bony abnormality identified. Dextrocurvature of the thoracic spine. IMPRESSION: Persistent although improved opacity within the left lung base. Correlating with the prior chest CT of 07/05/2021, this may  reflect resolving cavitary pneumonia. However, radiographic follow-up to complete resolution is recommended to exclude underlying malignancy at this site. Electronically Signed   By: 07/07/2021 D.O.   On: 07/20/2021 10:06   CT Chest Wo Contrast  Result Date: 07/05/2021 CLINICAL DATA:  A 41 year old female presents for evaluation of respiratory illness, nondiagnostic chest x-ray with cough. EXAM: CT CHEST WITHOUT CONTRAST TECHNIQUE: Multidetector CT imaging of the chest was performed following the standard protocol without IV contrast. RADIATION DOSE REDUCTION: This exam was performed according to the departmental dose-optimization program which includes automated exposure control, adjustment of the mA and/or kV according to patient size and/or use of iterative reconstruction technique. COMPARISON:  May 25, 2021 and prior chest x-rays. FINDINGS: Cardiovascular: Normal caliber of the thoracic aorta.  Normal heart size without pericardial effusion. Limited assessment of cardiovascular structures given lack of intravenous contrast. Mediastinum/Nodes: No thoracic inlet, axillary or supraclavicular lymphadenopathy. No mediastinal adenopathy. No gross hilar adenopathy though there are borderline enlarged lymph nodes in the LEFT hilum up to 11 mm (image 61/2) Lungs/Pleura: Small LEFT-sided pleural effusion. Irregular cavitary thick-walled area in the LEFT lung base measuring 5.4 x 3.7 cm. Adjacent micro nodules and satellite nodules are present throughout the LEFT lower lobe. For instance on image 76/4 there is a 7 mm nodule, this represents the largest peripheral nodule in the LEFT lower lobe. RIGHT lung is clear.  Airways are patent. Upper Abdomen: Marked hepatic steatosis. Gallbladder is collapsed. Liver is incompletely imaged. No peripancreatic stranding. Spleen is unremarkable. Musculoskeletal: No chest wall abnormality. No acute bone finding. Spinal degenerative changes. IMPRESSION: 1. Irregular cavitary  thick-walled area in the LEFT lung base measuring 5.4 x 3.7 cm. Adjacent micro nodules and satellite nodules are present throughout the LEFT lower lobe. Findings are concerning for a cavitary pneumonia. Given persistence and thick-walled appearance would suggest chest follow-up to ensure resolution and exclude underlying mass or neoplasm. Given persistence would also suggest pulmonary consultation for further assessment as in addition more aggressive or atypical infectious agents are considered based on appearance. 2. Small LEFT parapneumonic effusion. 3. Borderline enlarged LEFT hilar lymph nodes, likely reactive. Attention on follow-up. 4. Marked hepatic steatosis. Electronically Signed   By: Donzetta Kohut M.D.   On: 07/05/2021 15:34   DG Chest 2 View  Result Date: 06/28/2021 CLINICAL DATA:  Pneumonia follow-up. EXAM: CHEST - 2 VIEW COMPARISON:  06/04/2021 FINDINGS: Improved but persistent retrocardiac left base opacity without substantial pleural effusion. Right lung clear. The cardiopericardial silhouette is within normal limits for size. The visualized bony structures of the thorax are unremarkable. IMPRESSION: Improved but persistent fairly prominent retrocardiac left base opacity. Given the long persistence, CT imaging may be warranted to further evaluate. Electronically Signed   By: Kennith Center M.D.   On: 06/28/2021 18:51          No data to display          No results found for: "NITRICOXIDE"      Assessment & Plan:   Cavitary pneumonia She is clinically improved and significantly better than she has been for the past few months. Reports being back to her baseline for the most part. Still has a minimal dry cough, which does not bother her and is improving. Her CXR today showed persistent LLL opacity that is continuing to improve. We discussed that radiographic improvement can lag behind clinical. She completed levaquin around 10 days ago. We will plan for repeat CT scan 6 weeks  from completion date to ensure resolution. Recheck CBC today to ensure resolution or downtrend of leukocytosis.   Autoimmune workup unremarkable aside from weak positive ANA, likely due to illness. No systemic symptoms. We will recheck in the future.   Patient Instructions  Continue Albuterol inhaler 2 puffs every 6 hours as needed for shortness of breath or wheezing. Notify if symptoms persist despite rescue inhaler/neb use. Continue flonase nasal spray 2 sprays each nostril daily  Start singulair 10 mg At bedtime for allergies. Monitor and notify of any mood changes.   Referral to allergist due to environmental allergies   Labs today - recheck CBC   Repeat CT chest in mid August - someone will contact you for scheduling  Follow up after CT last week of August with Dr. Marchelle Gearing or  Katie Pedro Whiters,NP. If symptoms do not improve or worsen, please contact office for sooner follow up or seek emergency care.     Allergic rhinitis Positive RAST to multiple environmental triggers, including a very high positive to cat dander. She has one cat at home, previously with three. She does have allergy symptoms which are mostly controlled with Xyzal but occasionally has some breakthrough. We will add singulair. We are also going to send referral to allergist as she is interested in allergy shots.   DOE (dyspnea on exertion) Significant improvement. She reports feeling back to her baseline. She does think she may have some underlying asthma. Uses her albuterol inhaler about once a week normally, aside from allergy seasons when she uses it daily. Given her positive RAST and the fact she lives with cats, suspicion for allergic asthma. Continue PRN albuterol. Starting her on singulair for allergic rhinitis. We will plan for PFTs at future visit once entirely healed from recent pneumonia. She may need ICS during allergy seasons.    I spent 38 minutes of dedicated to the care of this patient on the date of this  encounter to include pre-visit review of records, face-to-face time with the patient discussing conditions above, post visit ordering of testing, clinical documentation with the electronic health record, making appropriate referrals as documented, and communicating necessary findings to members of the patients care team.  Noemi ChapelKatherine V Dorita Rowlands, NP 07/20/2021  Pt aware and understands NP's role.

## 2021-07-20 NOTE — Assessment & Plan Note (Addendum)
Significant improvement. She reports feeling back to her baseline. She does think she may have some underlying asthma. Uses her albuterol inhaler about once a week normally, aside from allergy seasons when she uses it daily. Given her positive RAST and the fact she lives with cats, suspicion for allergic asthma. Continue PRN albuterol. Starting her on singulair for allergic rhinitis. We will plan for PFTs at future visit once entirely healed from recent pneumonia. She may need ICS during allergy seasons.

## 2021-07-20 NOTE — Assessment & Plan Note (Addendum)
She is clinically improved and significantly better than she has been for the past few months. Reports being back to her baseline for the most part. Still has a minimal dry cough, which does not bother her and is improving. Her CXR today showed persistent LLL opacity that is continuing to improve. We discussed that radiographic improvement can lag behind clinical. She completed levaquin around 10 days ago. We will plan for repeat CT scan 6 weeks from completion date to ensure resolution. Recheck CBC today to ensure resolution or downtrend of leukocytosis.   Autoimmune workup unremarkable aside from weak positive ANA, likely due to illness. No systemic symptoms. We will recheck in the future.   Patient Instructions  Continue Albuterol inhaler 2 puffs every 6 hours as needed for shortness of breath or wheezing. Notify if symptoms persist despite rescue inhaler/neb use. Continue flonase nasal spray 2 sprays each nostril daily  Start singulair 10 mg At bedtime for allergies. Monitor and notify of any mood changes.   Referral to allergist due to environmental allergies   Labs today - recheck CBC   Repeat CT chest in mid August - someone will contact you for scheduling  Follow up after CT last week of August with Dr. Marchelle Gearing or Philis Nettle. If symptoms do not improve or worsen, please contact office for sooner follow up or seek emergency care.

## 2021-07-20 NOTE — Patient Instructions (Addendum)
Continue Albuterol inhaler 2 puffs every 6 hours as needed for shortness of breath or wheezing. Notify if symptoms persist despite rescue inhaler/neb use. Continue flonase nasal spray 2 sprays each nostril daily  Start singulair 10 mg At bedtime for allergies. Monitor and notify of any mood changes.   Referral to allergist due to environmental allergies   Labs today - recheck CBC   Repeat CT chest in mid August - someone will contact you for scheduling  Follow up after CT last week of August with Dr. Marchelle Gearing or Philis Nettle. If symptoms do not improve or worsen, please contact office for sooner follow up or seek emergency care.

## 2021-07-26 ENCOUNTER — Ambulatory Visit (INDEPENDENT_AMBULATORY_CARE_PROVIDER_SITE_OTHER): Payer: BC Managed Care – PPO | Admitting: Nurse Practitioner

## 2021-07-26 ENCOUNTER — Encounter (HOSPITAL_BASED_OUTPATIENT_CLINIC_OR_DEPARTMENT_OTHER): Payer: Self-pay | Admitting: Nurse Practitioner

## 2021-07-26 ENCOUNTER — Other Ambulatory Visit (HOSPITAL_BASED_OUTPATIENT_CLINIC_OR_DEPARTMENT_OTHER): Payer: Self-pay

## 2021-07-26 VITALS — BP 122/82 | HR 94 | Ht 68.0 in | Wt 264.0 lb

## 2021-07-26 DIAGNOSIS — Z8632 Personal history of gestational diabetes: Secondary | ICD-10-CM | POA: Diagnosis not present

## 2021-07-26 DIAGNOSIS — K439 Ventral hernia without obstruction or gangrene: Secondary | ICD-10-CM

## 2021-07-26 DIAGNOSIS — J984 Other disorders of lung: Secondary | ICD-10-CM

## 2021-07-26 DIAGNOSIS — F419 Anxiety disorder, unspecified: Secondary | ICD-10-CM

## 2021-07-26 DIAGNOSIS — J189 Pneumonia, unspecified organism: Secondary | ICD-10-CM | POA: Diagnosis not present

## 2021-07-26 DIAGNOSIS — F32A Depression, unspecified: Secondary | ICD-10-CM

## 2021-07-26 DIAGNOSIS — Z6841 Body Mass Index (BMI) 40.0 and over, adult: Secondary | ICD-10-CM

## 2021-07-26 LAB — POCT GLYCOSYLATED HEMOGLOBIN (HGB A1C)
HbA1c POC (<> result, manual entry): 4.7 % (ref 4.0–5.6)
HbA1c, POC (controlled diabetic range): 4.7 % (ref 0.0–7.0)
HbA1c, POC (prediabetic range): 4.7 % — AB (ref 5.7–6.4)
Hemoglobin A1C: 4.7 % (ref 4.0–5.6)

## 2021-07-26 MED ORDER — WEGOVY 0.5 MG/0.5ML ~~LOC~~ SOAJ
0.5000 mg | SUBCUTANEOUS | 2 refills | Status: DC
  Filled 2021-07-26: qty 2, fill #0
  Filled 2021-09-13: qty 2, 28d supply, fill #0

## 2021-07-26 MED ORDER — WEGOVY 0.25 MG/0.5ML ~~LOC~~ SOAJ
0.2500 mg | SUBCUTANEOUS | 0 refills | Status: DC
  Filled 2021-07-26: qty 2, 28d supply, fill #0

## 2021-07-26 NOTE — Assessment & Plan Note (Signed)
Significant improvement from the previous time she was seen.  Her symptoms have primarily resolved other than an intermittent cough.  Very mildly coarse lung sounds in the left lower lobe present today.  She has been seen by pulmonology and repeat x-ray did show that the opacity in the left lower lobe was improving.  She is no longer on antibiotic therapy and is not having any alarm symptoms.  She did have an extensive autoimmune work-up with pulmonology recently due to the significance of the unprovoked pneumonia-these results were primarily negative with a weak positive ANA.  She tells me that pulmonology has repeated this for further evaluation.  I will follow along.  Recommend continuation of medications with pulmonology.  She is aware of alarm symptoms that would warrant immediate evaluation.  No further follow-up with me for this concern however recommend continuation with pulmonology as recommended.

## 2021-07-26 NOTE — Assessment & Plan Note (Signed)
We will monitor hemoglobin A1c today due to history of gestational diabetes.

## 2021-07-26 NOTE — Patient Instructions (Addendum)
We will check your A1c today and see what it looks like.   I will send in the prescription for the [facility] and we will work on getting insurance approval. If we get samples in the next few days we will let you know and we can get you started on that.

## 2021-07-26 NOTE — Assessment & Plan Note (Signed)
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  Herniation has increased in size since her last evaluation, most likely due to the significant increased intrathoracic pressure associated with chronic coughing from her pneumonia.  A referral has been placed for surgery.  Patient is concerned that her current weight will limit her ability to have the surgery.  Discussed the impact that weight loss may have on both the herniation and her ability to undergo surgical repair.  We will start medication for weight loss today.

## 2021-07-26 NOTE — Assessment & Plan Note (Signed)
Chronic anxiety and depression symptoms.  She was recently titrated off of sertraline and started on Prozac due to concerns with weight gain associated with sertraline.  At this time she reports she is doing very well on the current medication regimen.  She has not had any notable weight loss but she also does not feel she has gained any since her last visit.  We will plan to continue this current medication regimen as it is effective for her.  We can plan to follow-up annually as long as she remains stable

## 2021-07-26 NOTE — Progress Notes (Signed)
Emily Clamp, DNP, AGNP-c [facility] [address] [place], [place] [address] [phone] Office [phone] Fax  ESTABLISHED PATIENT- Chronic Health and/or Follow-Up Visit  Blood pressure 122/82, pulse 94, height 5\' 8"  (1.727 m), weight 264 lb (119.7 kg), SpO2 97 %, unknown if currently breastfeeding. No longer breast feeding.  Follow-up (Patient presents today for medication follow up, she is doing great. Cough is much better. )   HPI  Emily Sharp  is a 41 y.o. year old female presenting today for evaluation and management of the following: Anxiety and depression Avonne tells me that she has been taking her Prozac 40 mg daily and is feeling that her symptoms are much improved.  She denies any negative side effects to the medication and would like to continue on this dose at this time. Cavitating pneumonia Tamkia has been seen by pulmonology and her recent evaluation did show a small amount of pneumonia remaining however there is a significant improvement.  The plan is to continue with her current symptomatic management and monitor closely for new or worsening symptoms.  We will plan for repeat CT for evaluation of complete resolution in the future.  She denies any fevers, chills, muscle aches, increased fatigue.  She is still experiencing a mild cough however she reports it is nothing like it was in the past. Weight management Emily Sharp endorses difficulty with managing her weight after going through in vitro fertilization for her twins.  She reports that despite diet and exercise changes that she has been unable to lose her weight and has been actually gaining.  Recently she has had a significant increase in her abdominal hernia and she feels that her increased weight is likely contributing to this.  She would like to try to lose weight in order to have surgery for repair of this.  She tells me that she did have gestational diabetes  while she was pregnant and would like to have her hemoglobin A1c monitored today to see if this could be contributing to her weight conditions.  ROS All ROS negative with exception of what is listed in HPI  PHYSICAL EXAM Physical Exam Vitals and nursing note reviewed.  Constitutional:      General: She is not in acute distress.    Appearance: Normal appearance. She is obese.  HENT:     Head: Normocephalic.  Eyes:     Extraocular Movements: Extraocular movements intact.     Conjunctiva/sclera: Conjunctivae normal.     Pupils: Pupils are equal, round, and reactive to light.  Neck:     Vascular: No carotid bruit.  Cardiovascular:     Rate and Rhythm: Normal rate and regular rhythm.     Pulses: Normal pulses.     Heart sounds: Normal heart sounds. No murmur heard. Pulmonary:     Effort: Pulmonary effort is normal.     Breath sounds: No wheezing.     Comments: Coarse lung sounds noted Abdominal:     General: Bowel sounds are normal. There is no distension.     Palpations: Abdomen is soft.     Tenderness: There is no abdominal tenderness. There is no guarding.     Hernia: A hernia is present.     Comments: Large abdominal hernia noted right of the umbilicus.  Nonreducible.  Musculoskeletal:        General: Normal range of motion.     Cervical back: Normal range of motion and neck supple.  Right lower leg: No edema.     Left lower leg: No edema.  Lymphadenopathy:     Cervical: No cervical adenopathy.  Skin:    General: Skin is warm and dry.     Capillary Refill: Capillary refill takes less than 2 seconds.  Neurological:     General: No focal deficit present.     Mental Status: She is alert and oriented to person, place, and time.  Psychiatric:        Mood and Affect: Mood normal.        Behavior: Behavior normal.        Thought Content: Thought content normal.        Judgment: Judgment normal.     ASSESSMENT & PLAN Problem List Items Addressed This Visit      Cavitary pneumonia    Significant improvement from the previous time she was seen.  Her symptoms have primarily resolved other than an intermittent cough.  Very mildly coarse lung sounds in the left lower lobe present today.  She has been seen by pulmonology and repeat x-ray did show that the opacity in the left lower lobe was improving.  She is no longer on antibiotic therapy and is not having any alarm symptoms.  She did have an extensive autoimmune work-up with pulmonology recently due to the significance of the unprovoked pneumonia-these results were primarily negative with a weak positive ANA.  She tells me that pulmonology has repeated this for further evaluation.  I will follow along.  Recommend continuation of medications with pulmonology.  She is aware of alarm symptoms that would warrant immediate evaluation.  No further follow-up with me for this concern however recommend continuation with pulmonology as recommended.      Hernia of anterior abdominal wall    Palpable herniation present to the abdomen just right of the midline.  Herniation has increased in size since her last evaluation, most likely due to the significant increased intrathoracic pressure associated with chronic coughing from her pneumonia.  A referral has been placed for surgery.  Patient is concerned that her current weight will limit her ability to have the surgery.  Discussed the impact that weight loss may have on both the herniation and her ability to undergo surgical repair.  We will start medication for weight loss today.      Relevant Medications   Semaglutide-Weight Management (WEGOVY) 0.5 MG/0.5ML SOAJ   Semaglutide-Weight Management (WEGOVY) 0.25 MG/0.5ML SOAJ   Anxiety and depression    Chronic anxiety and depression symptoms.  She was recently titrated off of sertraline and started on Prozac due to concerns with weight gain associated with sertraline.  At this time she reports she is doing very well on the current  medication regimen.  She has not had any notable weight loss but she also does not feel she has gained any since her last visit.  We will plan to continue this current medication regimen as it is effective for her.  We can plan to follow-up annually as long as she remains stable      History of gestational diabetes - Primary    We will monitor hemoglobin A1c today due to history of gestational diabetes.      Relevant Medications   Semaglutide-Weight Management (WEGOVY) 0.5 MG/0.5ML SOAJ   Semaglutide-Weight Management (WEGOVY) 0.25 MG/0.5ML SOAJ   Other Relevant Orders   POCT glycosylated hemoglobin (Hb A1C) (Completed)   BMI 40.0-44.9, adult (HCC)    BMI 40.14 in the office today.  Patient has been unsuccessful in weight loss despite medication changes, diet, and exercise.  We will obtain labs today for evaluation and monitoring to see if blood sugar could be a contributing culprit.  Given the patient's weight and current abdominal hernia presents I do feel that weight management would be beneficial for her.  Discussion of options with patient today and joint decision made to trial [facility].  Discussed with patient that this medication will need a prior authorization and therefore may take a few days to get this approval.  She expressed understanding.  We will plan to follow-up in approximately 3 months with telephone visit after medication has been started.      Relevant Medications   Semaglutide-Weight Management (WEGOVY) 0.5 MG/0.5ML SOAJ   Semaglutide-Weight Management (WEGOVY) 0.25 MG/0.5ML SOAJ     FOLLOW-UP Return in about 3 months (around 10/26/2021) for phone call- weight loss.  Emily Clamp, DNP, AGNP-c 07/26/2021  1:34 PM

## 2021-07-26 NOTE — Assessment & Plan Note (Signed)
BMI 40.14 in the office today.  Patient has been unsuccessful in weight loss despite medication changes, diet, and exercise.  We will obtain labs today for evaluation and monitoring to see if blood sugar could be a contributing culprit.  Given the patient's weight and current abdominal hernia presents I do feel that weight management would be beneficial for her.  Discussion of options with patient today and joint decision made to trial [facility].  Discussed with patient that this medication will need a prior authorization and therefore may take a few days to get this approval.  She expressed understanding.  We will plan to follow-up in approximately 3 months with telephone visit after medication has been started.

## 2021-07-30 ENCOUNTER — Other Ambulatory Visit: Payer: BC Managed Care – PPO

## 2021-07-31 ENCOUNTER — Telehealth (HOSPITAL_BASED_OUTPATIENT_CLINIC_OR_DEPARTMENT_OTHER): Payer: Self-pay

## 2021-07-31 NOTE — Telephone Encounter (Signed)
Key [id number] processed

## 2021-08-01 ENCOUNTER — Other Ambulatory Visit (HOSPITAL_BASED_OUTPATIENT_CLINIC_OR_DEPARTMENT_OTHER): Payer: Self-pay

## 2021-08-01 NOTE — Telephone Encounter (Signed)
Received a fax from [facility] for Approval for Wegovy from 07/31/21 - 03/03/22 Approval paperwork is in yellow tray for CMA.

## 2021-08-07 ENCOUNTER — Other Ambulatory Visit (HOSPITAL_BASED_OUTPATIENT_CLINIC_OR_DEPARTMENT_OTHER): Payer: Self-pay

## 2021-08-15 ENCOUNTER — Ambulatory Visit: Payer: BC Managed Care – PPO | Admitting: Internal Medicine

## 2021-08-15 ENCOUNTER — Encounter: Payer: Self-pay | Admitting: Internal Medicine

## 2021-08-15 VITALS — BP 102/74 | HR 80 | Temp 97.7°F | Resp 18 | Ht 68.5 in | Wt 266.4 lb

## 2021-08-15 DIAGNOSIS — Z889 Allergy status to unspecified drugs, medicaments and biological substances status: Secondary | ICD-10-CM

## 2021-08-15 DIAGNOSIS — J3089 Other allergic rhinitis: Secondary | ICD-10-CM

## 2021-08-15 DIAGNOSIS — J4530 Mild persistent asthma, uncomplicated: Secondary | ICD-10-CM | POA: Diagnosis not present

## 2021-08-15 DIAGNOSIS — L2084 Intrinsic (allergic) eczema: Secondary | ICD-10-CM

## 2021-08-15 DIAGNOSIS — H1013 Acute atopic conjunctivitis, bilateral: Secondary | ICD-10-CM | POA: Diagnosis not present

## 2021-08-15 DIAGNOSIS — K219 Gastro-esophageal reflux disease without esophagitis: Secondary | ICD-10-CM

## 2021-08-15 DIAGNOSIS — H1045 Other chronic allergic conjunctivitis: Secondary | ICD-10-CM

## 2021-08-15 MED ORDER — FLUOCINOLONE ACETONIDE SCALP 0.01 % EX OIL: 1.0000 mL | TOPICAL_OIL | Freq: Two times a day (BID) | CUTANEOUS | 1 refills | Status: DC

## 2021-08-15 MED ORDER — BUDESONIDE-FORMOTEROL FUMARATE 80-4.5 MCG/ACT IN AERO
2.0000 | INHALATION_SPRAY | Freq: Two times a day (BID) | RESPIRATORY_TRACT | 6 refills | Status: DC
Start: 2021-08-15 — End: 2023-06-03

## 2021-08-15 NOTE — Patient Instructions (Addendum)
Seasonal and perennial rhinitis: Not well controlled  - Testing today showed positive to grass pollen, tree pollen, weed pollen, dust mite, cat, dog, horse - Copy of test results provided.  - Avoidance measures provided. - Continue with: Xyzal (levocetirizine) 5mg  tablet once daily and Flonase (fluticasone) one spray per nostril daily - You can use an extra dose of the antihistamine, if needed, for breakthrough symptoms.  - Consider nasal saline rinses 1-2 times daily to remove allergens from the nasal cavities as well as help with mucous clearance (this is especially helpful to do before the nasal sprays are given) Start allergy injections. You are candidate for Rush immunotherapy or standard buildup, codes provided today to to ensure insurance coverage of RUSH.  If you would like to proceed with please let know Had a detailed discussion with patient/family that clinical history is suggestive of allergic rhinitis, and may benefit from allergy immunotherapy (AIT). Discussed in detail regarding the dosing, schedule, side effects (mild to moderate local allergic reaction and rarely systemic allergic reactions including anaphylaxis/death), alternatives and benefits (significant improvement in nasal symptoms, seasonal flares of asthma) of immunotherapy with the patient. There is significant time commitment involved with allergy shots, which includes weekly immunotherapy injections for first 9-12 months and then biweekly to monthly injections for 3-5 years. Clinical response is often delayed and patient may not see an improvement for 6-12 months. Consent was signed. I have prescribed epinephrine injectable and demonstrated proper use. For mild symptoms you can take over the counter antihistamines such as Benadryl and monitor symptoms closely. If symptoms worsen or if you have severe symptoms including breathing issues, throat closure, significant swelling, whole body hives, severe diarrhea and vomiting,  lightheadedness then inject epinephrine and seek immediate medical care afterwards. Action plan given.   Atopic Dermatitis:  Daily Care For Maintenance (daily and continue even once eczema controlled) - Recommend hypoallergenic hydrating ointment at least twice daily.  This must be done daily for control of flares. (Great options include Vaseline, CeraVe, Aquaphor, Aveeno, Cetaphil, VaniCream, etc) - Recommend avoiding detergents, soaps or lotions with fragrances/dyes, and instead using products which are hypoallergenic, use second rinse cycle when washing clothes -Wear lose breathable clothing, avoid wool -Avoid extremes of humidity - Limit showers/baths to 5 minutes and use luke warm water instead of hot, pat dry following baths, and apply moisturizer - can use steroid creams as detailed below up to twice weekly for prevention of flares.  For Flares:(add this to maintenance therapy if needed for flares) -Derma-Smoothe 0.01% oil twice daily as needed for flares on scalp and ears   Mild Persistent  Asthma: This may be resolving bronchospasm from pneumonia or new development of asthma - Breathing test today showed: Some inflammation in your lungs - However based on breathing test and symptoms and plan to start allergy injections we will step up treatment today -Hopefully will be able to stepdown in the about 3 months  PLAN:  - Spacer sample and demonstration provided. - Daily controller medication(s): Symbicort 80/4.34mcg two puffs twice daily with spacer - Prior to physical activity: albuterol 2 puffs 10-15 minutes before physical activity. - Rescue medications: albuterol 4 puffs every 4-6 hours as needed - Changes during respiratory infections or worsening symptoms: Increase Symbicort  to 4 puffs twice daily for TWO WEEKS. 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  Don't hesitate to reach out if you have any additional concerns!  Ferol Luz, MD  [facility] [place], [place]  Reducing Pollen Exposure  The American Academy of Allergy, Asthma and Immunology suggests the following steps to reduce your exposure to pollen during allergy seasons.    Do not hang sheets or clothing out to dry; pollen may collect on these items. Do not mow lawns or spend time around freshly cut grass; mowing stirs up pollen. Keep windows closed at night.  Keep car windows closed while driving. Minimize morning activities outdoors, a time when pollen counts are usually at their highest. Stay indoors as much as possible when pollen counts or humidity is high and on windy days when pollen tends to remain in the air longer. Use air conditioning when possible.  Many air conditioners have filters that trap the pollen spores. Use a HEPA room air filter to remove pollen form the indoor air you breathe.  Control of Dog or Cat Allergen  Avoidance is the best way to manage a dog or cat allergy. If you have a dog or cat and are allergic to dog or cats, consider removing the dog or cat from the home. If you have a dog or cat but don't want to find it a new home, or if your family wants a pet even though someone in the household is allergic, here are some strategies that may help keep symptoms at bay:  Keep the pet out of your bedroom and restrict it to only a few rooms. Be advised that  keeping the dog or cat in only one room will not limit the allergens to that room. Don't pet, hug or kiss the dog or cat; if you do, wash your hands with soap and water. High-efficiency particulate air (HEPA) cleaners run continuously in a bedroom or living room can reduce allergen levels over time. Regular use of a high-efficiency vacuum cleaner or a central vacuum can reduce allergen levels. Giving your dog or cat a bath at least once a week can reduce airborne allergen.  DUST MITE AVOIDANCE MEASURES:  There are three main measures that need and can be taken to avoid house dust mites:  Reduce accumulation of dust in general -reduce furniture, clothing, carpeting, books, stuffed animals, especially in bedroom  Separate yourself from the dust -use pillow and mattress encasements (can be found at stores such as [employer] or online) -avoid direct exposure to air condition flow -use a HEPA filter device, especially in the bedroom; you can also use a HEPA filter vacuum cleaner -wipe dust with a moist towel instead of a dry towel or broom when cleaning  Decrease mites and/or their secretions -wash clothing and linen and stuffed animals at highest temperature possible, at least every 2 weeks -stuffed animals can also be placed in a bag and put in a freezer overnight  Despite the above measures, it is impossible to eliminate dust mites or their allergen completely from your  home.  With the above measures the burden of mites in your home can be diminished, with the goal of minimizing your allergic symptoms.  Success will be reached only when implementing and using all means together.

## 2021-08-15 NOTE — Progress Notes (Signed)
New Patient Note  RE: Emily Sharp MRN: [id number] DOB: [birth year] Date of Office Visit: 08/15/2021  Consult requested by: Tollie Eth, NP Primary care provider: Tollie Eth, NP  Chief Complaint: Allergic Rhinitis , Allergy Testing, and Nasal Congestion (Pt states she's had frequent pneumonia episode in may 2023 and mild asthma symptoms she uses albuterol. Her allergies symptoms are sneezing, stuffy nose and some labor breathing.)  History of Present Illness: I had the pleasure of seeing Emily Sharp for initial evaluation at the [facility] on 08/15/2021. She is a 41 y.o. female, who is referred here by Early, Sung Amabile, NP for the evaluation of allergic rhinitis and dyspnea .  History obtained from patient  and  chart .  Chronic rhinitis: started in 2007 when she moved to [facility] and got cats Symptoms include:  recurrent sinus infections, nasal congestion, rhinorrhea, post nasal drainage, sneezing, watery eyes, itchy eyes, and itchy nose , mild hyposmia  Occurs year-round Potential triggers: cats  Treatments tried: xyzal, netipot, flonase - will control symptoms  Previous allergy testing:  Specific IgE positive to dust mite, cat, dog, Box Elder, [place], [place], [place] History of reflux/heartburn:  since pregnancy she has mild symptoms  History of chronic sinusitis or sinus surgery: no Nonallergic triggers:  denies    Atopic Dermatitis:  Diagnosed at age as a child , flares mostly ears and scalp . Previous therapies tried using sons prescription, dermasmoothe oil  Current regimen: currently using sons prescriptions    Reports use of fragrance/dye free products Identified triggers of flares include none Sleep un affected  In May she was diagnosed with pneumonia and has had persistent dyspnea since.  She does follow with pulmonary.  She has not been formally diagnosed with asthma.  However she does have albuterol prescribed for symptoms.  Using albuterol  couple times a week.   Assessment and Plan: [name] is a 41 y.o. female with: Other allergic rhinitis - Plan: Allergy Test  Other chronic allergic conjunctivitis of both eyes - Plan: Allergy Test  Gastroesophageal reflux disease without esophagitis  Intrinsic atopic dermatitis - Plan: Allergy Test  History of drug allergy  Mild persistent asthma without complication - Plan: Spirometry with Graph, Allergy Test Plan: Patient Instructions  Seasonal and perennial rhinitis: Not well controlled  - Testing today showed positive to grass pollen, tree pollen, weed pollen, dust mite, cat, dog, horse - Copy of test results provided.  - Avoidance measures provided. - Continue with: Xyzal (levocetirizine) 5mg  tablet once daily and Flonase (fluticasone) one spray per nostril daily - You can use an extra dose of the antihistamine, if needed, for breakthrough symptoms.  - Consider nasal saline rinses 1-2 times daily to remove allergens from the nasal cavities as well as help with mucous clearance (this is especially helpful to do before the nasal sprays are given) Start allergy injections. You are candidate for Rush immunotherapy or standard buildup, codes provided today to to ensure insurance coverage of RUSH.  If you would like to proceed with please let know Had a detailed discussion with patient/family that clinical history is suggestive of allergic rhinitis, and may benefit from allergy immunotherapy (AIT). Discussed in detail regarding the dosing, schedule, side effects (mild to moderate local allergic reaction and rarely systemic allergic reactions including anaphylaxis/death), alternatives and benefits (significant improvement in nasal symptoms, seasonal flares of asthma) of immunotherapy with the patient. There is significant time commitment involved with allergy shots, which includes weekly immunotherapy  injections for first 9-12 months and then biweekly to monthly injections for 3-5 years.  Clinical response is often delayed and patient may not see an improvement for 6-12 months. Consent was signed. I have prescribed epinephrine injectable and demonstrated proper use. For mild symptoms you can take over the counter antihistamines such as Benadryl and monitor symptoms closely. If symptoms worsen or if you have severe symptoms including breathing issues, throat closure, significant swelling, whole body hives, severe diarrhea and vomiting, lightheadedness then inject epinephrine and seek immediate medical care afterwards. Action plan given.   Atopic Dermatitis:  Daily Care For Maintenance (daily and continue even once eczema controlled) - Recommend hypoallergenic hydrating ointment at least twice daily.  This must be done daily for control of flares. (Great options include Vaseline, CeraVe, Aquaphor, Aveeno, Cetaphil, VaniCream, etc) - Recommend avoiding detergents, soaps or lotions with fragrances/dyes, and instead using products which are hypoallergenic, use second rinse cycle when washing clothes -Wear lose breathable clothing, avoid wool -Avoid extremes of humidity - Limit showers/baths to 5 minutes and use luke warm water instead of hot, pat dry following baths, and apply moisturizer - can use steroid creams as detailed below up to twice weekly for prevention of flares.  For Flares:(add this to maintenance therapy if needed for flares) -Derma-Smoothe 0.01% oil twice daily as needed for flares on scalp and ears   Mild Persistent  Asthma: This may be resolving bronchospasm from pneumonia or new development of asthma - Breathing test today showed: Some inflammation in your lungs - However based on breathing test and symptoms and plan to start allergy injections we will step up treatment today -Hopefully will be able to stepdown in the about 3 months  PLAN:  - Spacer sample and demonstration provided. - Daily controller medication(s): Symbicort 80/4.73mcg two puffs twice daily with  spacer - Prior to physical activity: albuterol 2 puffs 10-15 minutes before physical activity. - Rescue medications: albuterol 4 puffs every 4-6 hours as needed - Changes during respiratory infections or worsening symptoms: Increase Symbicort  to 4 puffs twice daily for TWO WEEKS. - Asthma control goals:  * Full participation in all desired activities (may need albuterol before activity) * Albuterol use two time or less a week on average (not counting use with activity) * Cough interfering with sleep two time or less a month * Oral steroids no more than once a year * No hospitalizations   Drug Allergy  -Continue to avoid Ancef and sulfa drugs -We could consider evaluation for Ancef allergy in the future, because after 10 years people generally outgrow this allergy -Sulfa allergy tends to be more persistent and I would recommend continue avoidance  Follow up:   Thank you so much for letting me partake in your care today.  Don't hesitate to reach out if you have any additional concerns!  Ferol Luz, MD  [facility] [place] [place]  Reducing Pollen Exposure  The American Academy of Allergy, Asthma and Immunology suggests the following steps to reduce your exposure to pollen during allergy seasons.    Do not hang sheets or clothing out to dry; pollen may collect on these items. Do not mow lawns or spend time around freshly cut grass; mowing stirs up pollen. Keep windows closed at night.  Keep car windows closed while driving. Minimize morning activities outdoors, a time when pollen counts are usually at their highest. Stay indoors as much as possible when pollen counts or humidity is high and on windy  days when pollen tends to remain in the air longer. Use air conditioning when possible.  Many air conditioners have filters that trap the pollen spores. Use a HEPA room air filter to remove pollen form the indoor air you breathe.  Control of Dog or Cat  Allergen  Avoidance is the best way to manage a dog or cat allergy. If you have a dog or cat and are allergic to dog or cats, consider removing the dog or cat from the home. If you have a dog or cat but don't want to find it a new home, or if your family wants a pet even though someone in the household is allergic, here are some strategies that may help keep symptoms at bay:  Keep the pet out of your bedroom and restrict it to only a few rooms. Be advised that keeping the dog or cat in only one room will not limit the allergens to that room. Don't pet, hug or kiss the dog or cat; if you do, wash your hands with soap and water. High-efficiency particulate air (HEPA) cleaners run continuously in a bedroom or living room can reduce allergen levels over time. Regular use of a high-efficiency vacuum cleaner or a central vacuum can reduce allergen levels. Giving your dog or cat a bath at least once a week can reduce airborne allergen.  DUST MITE AVOIDANCE MEASURES:  There are three main measures that need and can be taken to avoid house dust mites:  Reduce accumulation of dust in general -reduce furniture, clothing, carpeting, books, stuffed animals, especially in bedroom  Separate yourself from the dust -use pillow and mattress encasements (can be found at stores such as [employer] or online) -avoid direct exposure to air condition flow -use a HEPA filter device, especially in the bedroom; you can also use a HEPA filter vacuum cleaner -wipe dust with a moist towel instead of a dry towel or broom when cleaning  Decrease mites and/or their secretions -wash clothing and linen and stuffed animals at highest temperature possible, at least every 2 weeks -stuffed animals can also be placed in a bag and put in a freezer overnight  Despite the above measures, it is impossible to eliminate dust mites or their allergen completely from your home.  With the above measures the burden of mites in  your home can be diminished, with the goal of minimizing your allergic symptoms.  Success will be reached only when implementing and using all means together.  No follow-ups on file.  Meds ordered this encounter  Medications   Fluocinolone Acetonide Scalp (DERMA-SMOOTHE/FS SCALP) 0.01 % OIL    Sig: Apply 1 mL topically 2 (two) times daily.    Dispense:  118.28 mL    Refill:  1   budesonide-formoterol (SYMBICORT) 80-4.5 MCG/ACT inhaler    Sig: Inhale 2 puffs into the lungs 2 (two) times daily.    Dispense:  1 each    Refill:  6   Lab Orders  No laboratory test(s) ordered today    Other allergy screening: Asthma: yes Rhino conjunctivitis: yes Food allergy: no Medication allergy:  YES- ancef and sulfas will she develop rash  Hymenoptera allergy:  no Urticaria: no Eczema: Yes  History of recurrent infections suggestive of immunodeficency: no  Diagnostics: Spirometry:  Tracings reviewed. Her effort: Good reproducible efforts. FVC: 2.84 L FEV1: 2.42 L, 70% predicted FEV1/FVC ratio: 85% Interpretation: Spirometry consistent with possible restrictive disease.  Please see scanned spirometry results for details.  Skin Testing: Environmental allergy panel. Adequate controls positive to grass pollen, weed pollen, tree pollen, dust mite, cat, dog, horse Results interpreted by myself and discussed with patient/family.  Airborne Adult Perc - 08/15/21 0957     Time Antigen Placed 0957    Allergen Manufacturer Waynette Buttery    Location Back    Number of Test 59    1. Control-Buffer 50% Glycerol Negative    2. Control-Histamine 1 mg/ml 4+    3. Albumin saline Negative    4. Bahia 4+    5. [place] 4+    6. Johnson 4+    7. [place] Blue 4+    8. Meadow Fescue 4+    9. Perennial Rye 4+    10. Sweet Vernal 4+    11. Timothy 4+    12. Cocklebur Negative    13. Burweed Marshelder Negative    14. Ragweed, short 3+    15. Ragweed, Giant 3+    16. Plantain,  English 3+    17. Lamb's  Quarters 3+    18. Sheep Sorrell 3+    19. Rough Pigweed Negative    20. Marsh Elder, Rough Negative    21. Mugwort, Common 3+    22. Ash mix Negative    23. Birch mix Negative    24. Beech American Negative    25. Box, Elder 3+    26. Cedar, red Negative    27. Cottonwood, Guinea-Bissau Negative    28. Elm mix Negative    29. Hickory 3+    30. Maple mix Negative    31. Oak, [place]iss[place] mix 3+    32. Pecan Pollen 3+    33. Pine mix Negative    34. Sycamore Eastern Negative    35. Walnut, Black Pollen Negative    36. Alternaria alternata Negative    37. Cladosporium Herbarum Negative    38. Aspergillus mix Negative    39. Penicillium mix Negative    40. Bipolaris sorokiniana (Helminthosporium) Negative    41. Drechslera spicifera (Curvularia) Negative    42. Mucor plumbeus Negative    43. Fusarium moniliforme Negative    44. Aureobasidium pullulans (pullulara) Negative    45. Rhizopus oryzae Negative    46. Botrytis cinera Negative    47. Epicoccum nigrum Negative    48. Phoma betae Negative    49. Candida Albicans Negative    50. Trichophyton mentagrophytes Negative    51. Mite, D Farinae  5,000 AU/ml 4+    52. Mite, D Pteronyssinus  5,000 AU/ml Negative    53. Cat Hair 10,000 BAU/ml 4+    54.  Dog Epithelia 4+    55. Mixed Feathers Negative    56. Horse Epithelia 3+    57. Cockroach, German Negative    58. Mouse Negative    59. Tobacco Leaf Negative             Past Medical History: Patient Active Problem List   Diagnosis Date Noted   History of gestational diabetes 07/26/2021   Pneumonia of left lower lobe due to infectious organism 07/26/2021   BMI 40.0-44.9, adult (HCC) 07/26/2021   Allergic rhinitis 07/20/2021   DOE (dyspnea on exertion) 07/20/2021   Cavitary pneumonia 07/06/2021   Hernia of anterior abdominal wall 07/06/2021   Anxiety and depression 07/06/2021   Hidradenitis suppurativa 07/06/2021   Abnormal ECG 05/09/2016   Sleep apnea 05/09/2016   Status  post bilateral salpingectomy 11/01/2013   Past Medical History:  Diagnosis Date  Abnormal glucose tolerance test (GTT) during pregnancy, antepartum 04/25/2017   Anxiety and depression    Asthma    Bradycardia 05/09/2016   Dichorionic diamniotic twin pregnancy, antepartum 05/06/2017   Encounter for assisted reproductive fertility procedure cycle    Encounter for fertility testing    Female infertility associated with anovulation    Gestational diabetes    Gestational diabetes 06/10/2017   H/O cesarean section 06/10/2017   Multiple gestation 06/10/2017   PCOS (polycystic ovarian syndrome)    PIH (pregnancy induced hypertension) 06/10/2017   Polyp of corpus uteri    Past Surgical History: Past Surgical History:  Procedure Laterality Date   BREAST REDUCTION SURGERY     CESAREAN SECTION MULTI-GESTATIONAL N/A 06/10/2017   Procedure: PRIMARY CESAREAN SECTION MULTI-GESTATIONAL;  Surgeon: Harold Hedge, MD;  Location: [facility];  Service: Obstetrics;  Laterality: N/A;  [name], RNFA   DILATION AND CURETTAGE OF UTERUS     INCISIONAL HERNIA REPAIR     OVARIAN CYST REMOVAL     WISDOM TOOTH EXTRACTION     Medication List:  Current Outpatient Medications  Medication Sig Dispense Refill   albuterol (VENTOLIN HFA) 108 (90 Base) MCG/ACT inhaler Inhale 2 puffs into the lungs every 6 (six) hours as needed for wheezing or shortness of breath (Cough). 18 g 0   budesonide-formoterol (SYMBICORT) 80-4.5 MCG/ACT inhaler Inhale 2 puffs into the lungs 2 (two) times daily. 1 each 6   etonogestrel-ethinyl estradiol (NUVARING) 0.12-0.015 MG/24HR vaginal ring INSERT 1 RING VAGINALLY AS DIRECTED. REMOVE AFTER 3 WEEKS & WAIT 7 DAYS BEFORE INSERTING A NEW RING     Fluocinolone Acetonide Scalp (DERMA-SMOOTHE/FS SCALP) 0.01 % OIL Apply 1 mL topically 2 (two) times daily. 118.28 mL 1   FLUoxetine (PROZAC) 40 MG capsule Take 1 capsule (40 mg total) by mouth daily. Start after finishing the 20mg  prescription. 90 capsule  3   fluticasone (FLONASE) 50 MCG/ACT nasal spray Place 1 spray into both nostrils daily. Begin by using 2 sprays in each nare daily for 3 to 5 days, then decrease to 1 spray in each nare daily. 32 mL 1   Semaglutide-Weight Management (WEGOVY) 0.25 MG/0.5ML SOAJ Inject 0.25 mg into the skin once a week. 2 mL 0   Semaglutide-Weight Management (WEGOVY) 0.5 MG/0.5ML SOAJ Inject 0.5 mg into the skin once a week. 2 mL 2   montelukast (SINGULAIR) 10 MG tablet Take 10 mg by mouth daily. (Patient not taking: Reported on 08/15/2021)     No current facility-administered medications for this visit.   Allergies: Allergies  Allergen Reactions   Sulfa Antibiotics Hives    HIVES, RASH   Ancef [Cefazolin] Rash and Cough   Social History: Social History   Socioeconomic History   Marital status: Married    Spouse name: Jeb   Number of children: 2   Years of education: Not on file   Highest education level: Not on file  Occupational History   Occupation: 10/15/2021  Tobacco Use   Smoking status: Never   Smokeless tobacco: Never  Vaping Use   Vaping Use: Not on file  Substance and Sexual Activity   Alcohol use: Yes    Comment: soical   Drug use: Never   Sexual activity: Yes    Birth control/protection: None, Other-see comments    Comment: Husband had vascotomy  Other Topics Concern   Not on file  Social History Narrative   ** Merged History Encounter **       Lives with husband.  Social Determinants of Health   Financial Resource Strain: Not on file  Food Insecurity: Not on file  Transportation Needs: Not on file  Physical Activity: Not on file  Stress: Not on file  Social Connections: Not on file   Lives in a single-family home, that was built in 1989.  There are no roaches in the house and bed is 2 feet off the floor.  There are dust mite precautions on bed but not pillows.  She is not exposed to fumes, chemicals or dust at home but is at her job.  There is no HEPA filter in the home  and home is not near an interstate industrial area. Smoking: No exposure Occupation: Second Mudlogger HistorySurveyor, minerals in the house: no Engineer, civil (consulting) in the family room: no Carpet in the bedroom: no Heating: gas Cooling: central Pet: yes 1 cat which she is isolated from the bedroom  Family History: Family History  Problem Relation Age of Onset   Skin cancer Mother    Diabetes Father    Peripheral vascular disease Father    Bladder Cancer Father    Hypertension Father    AAA (abdominal aortic aneurysm) Father    Skin cancer Father    Skin cancer Maternal Grandfather      ROS: All others negative except as noted per HPI.   Objective: BP 102/74   Pulse 80   Temp 97.7 F (36.5 C) (Temporal)   Resp 18   Ht 5' 8.5" (1.74 m)   Wt 266 lb 6.4 oz (120.8 kg)   SpO2 98%   BMI 39.92 kg/m  Body mass index is 39.92 kg/m.  General Appearance:  Alert, cooperative, no distress, appears stated age  Head:  Normocephalic, without obvious abnormality, atraumatic  Eyes:  Conjunctiva clear, EOM's intact  Nose: Nares normal,  erythematous nasal mucosa with scant yellow rhinorrhea, hypertrophic turbinates, no visible anterior polyps, and septum midline  Throat: Lips, tongue normal; teeth and gums normal, normal posterior oropharynx and no tonsillar exudate  Neck: Supple, symmetrical  Lungs:   clear to auscultation bilaterally, Respirations unlabored, no coughing  Heart:  regular rate and rhythm and no murmur, Appears well perfused  Extremities: No edema  Skin: Skin color, texture, turgor normal, no rashes or lesions on visualized portions of skin  Neurologic: No gross deficits   The plan was reviewed with the patient/family, and all questions/concerned were addressed.  It was my pleasure to see Emily Sharp today and participate in her care. Please feel free to contact me with any questions or concerns.  Sincerely,  Ferol Luz, MD Allergy &  Immunology  [facility] office: [phone] [facility] office: [phone]

## 2021-08-16 DIAGNOSIS — J3089 Other allergic rhinitis: Secondary | ICD-10-CM

## 2021-08-16 NOTE — Progress Notes (Signed)
Aeroallergen Immunotherapy   Ordering Provider: Dr. Ferol Luz   Patient Details  Name: Emily Sharp  MRN: [id number]  Date of Birth: 1980-12-12   Order 1 of 1   Vial Label: G-W-T-C-D-Dm   0.3 ml (Volume)  BAU Concentration -- 7 Grass Mix* 100,000 ([address] [place], [place], [place], [place] Rye, RedTop, Sweet Vernal, Timothy)  0.2 ml (Volume)  1:20 Concentration -- Bahia  0.3 ml (Volume)  BAU Concentration -- [place] 10,000  0.2 ml (Volume)  1:20 Concentration -- Johnson  0.3 ml (Volume)  1:20 Concentration -- Ragweed Mix  0.2 ml (Volume)  1:10 Concentration -- Plantain English  0.5 ml (Volume)  1:20 Concentration -- Weed Mix*  0.2 ml (Volume)  1:20 Concentration -- Box Elder  0.2 ml (Volume)  1:10 Concentration -- Hickory*  0.3 ml (Volume)  1:10 Concentration -- Oak, Guinea-Bissau mix*  0.2 ml (Volume)  1:10 Concentration -- Pecan Pollen  0.5 ml (Volume)  1:10 Concentration -- Cat Hair  0.5 ml (Volume)  1:10 Concentration -- Dog Epithelia  0.5 ml (Volume)   AU Concentration -- Mite Mix (DF 5,000 & DP 5,000)    4.4  ml Extract Subtotal  0.6  ml Diluent  5.0  ml Maintenance Total   Schedule:  RUSH  Silver Vial (1:1,000,000): RUSH  Blue Vial (1:100,000): RUSH  Yellow Vial (1:10,000): RUSH  Green Vial (1:1,000): Schedule B (6 doses)  Red Vial (1:100): Schedule A (10 doses)   Special Instructions: RUSH

## 2021-08-16 NOTE — Progress Notes (Signed)
VIALS EXP 08-17-22 

## 2021-08-17 MED ORDER — EPINEPHRINE 0.3 MG/0.3ML IJ SOAJ: 0.3000 mg | INTRAMUSCULAR | 1 refills | Status: AC | PRN

## 2021-08-17 MED ORDER — PREDNISONE 20 MG PO TABS: ORAL_TABLET | ORAL | 0 refills | Status: DC

## 2021-08-17 NOTE — Addendum Note (Signed)
Addended by: Bryson Corona on: 08/17/2021 04:51 PM   Modules accepted: Orders

## 2021-08-21 ENCOUNTER — Ambulatory Visit: Payer: BC Managed Care – PPO | Admitting: Internal Medicine

## 2021-08-21 VITALS — BP 104/70 | HR 70 | Temp 97.9°F | Resp 18

## 2021-08-21 DIAGNOSIS — J309 Allergic rhinitis, unspecified: Secondary | ICD-10-CM

## 2021-08-21 DIAGNOSIS — J3089 Other allergic rhinitis: Secondary | ICD-10-CM

## 2021-08-21 DIAGNOSIS — H1045 Other chronic allergic conjunctivitis: Secondary | ICD-10-CM

## 2021-08-21 NOTE — Progress Notes (Signed)
RAPID DESENSITIZATION Note  RE: Anntionette Madkins MRN: [id number] DOB: Oct 30, 1980 Date of Office Visit: 08/21/2021  Subjective:  Patient presents today for rapid desensitization.  Interval History: Patient has not been ill, she has taken all premedications as per protocol.  Recent/Current History: Pulmonary disease: no Cardiac disease: no Respiratory infection: no Rash: no Itch: no Swelling: no Cough: no Shortness of breath: no Runny/stuffy nose: no Itchy eyes: no Beta-blocker use: no  Patient/guardian was informed of the procedure with verbalized understanding of the risk of anaphylaxis. Consent has been signed.   Medication List:  Current Outpatient Medications  Medication Sig Dispense Refill   albuterol (VENTOLIN HFA) 108 (90 Base) MCG/ACT inhaler Inhale 2 puffs into the lungs every 6 (six) hours as needed for wheezing or shortness of breath (Cough). 18 g 0   budesonide-formoterol (SYMBICORT) 80-4.5 MCG/ACT inhaler Inhale 2 puffs into the lungs 2 (two) times daily. 1 each 6   EPINEPHrine 0.3 mg/0.3 mL IJ SOAJ injection Inject 0.3 mg into the muscle as needed for anaphylaxis. 1 each 1   etonogestrel-ethinyl estradiol (NUVARING) 0.12-0.015 MG/24HR vaginal ring INSERT 1 RING VAGINALLY AS DIRECTED. REMOVE AFTER 3 WEEKS & WAIT 7 DAYS BEFORE INSERTING A NEW RING     Fluocinolone Acetonide Scalp (DERMA-SMOOTHE/FS SCALP) 0.01 % OIL Apply 1 mL topically 2 (two) times daily. 118.28 mL 1   FLUoxetine (PROZAC) 40 MG capsule Take 1 capsule (40 mg total) by mouth daily. Start after finishing the 20mg  prescription. 90 capsule 3   fluticasone (FLONASE) 50 MCG/ACT nasal spray Place 1 spray into both nostrils daily. Begin by using 2 sprays in each nare daily for 3 to 5 days, then decrease to 1 spray in each nare daily. 32 mL 1   montelukast (SINGULAIR) 10 MG tablet Take 10 mg by mouth daily. (Patient not taking: Reported on 08/15/2021)     predniSONE (DELTASONE) 20 MG tablet Take 1 tablet  Monday morning and 1 tablet Tuesday morning prior to [facility] appt 2 tablet 0   Semaglutide-Weight Management (WEGOVY) 0.25 MG/0.5ML SOAJ Inject 0.25 mg into the skin once a week. 2 mL 0   Semaglutide-Weight Management (WEGOVY) 0.5 MG/0.5ML SOAJ Inject 0.5 mg into the skin once a week. 2 mL 2   No current facility-administered medications for this visit.   Allergies: Allergies  Allergen Reactions   Sulfa Antibiotics Hives    HIVES, RASH   Ancef [Cefazolin] Rash and Cough   I reviewed her past medical history, social history, family history, and environmental history and no significant changes have been reported from her previous visit.  ROS: Negative except as per HPI.  Objective: There were no vitals taken for this visit. There is no height or weight on file to calculate BMI.   General Appearance:  Alert, cooperative, no distress, appears stated age  Head:  Normocephalic, without obvious abnormality, atraumatic  Eyes:  Conjunctiva clear, EOM's intact  Nose: Nares normal  Throat: Lips, tongue normal; teeth and gums normal, normal  posterior oropharnyx  Neck: Supple, symmetrical  Lungs:   normal, Respirations unlabored, no coughing  Heart:  Appears well perfused  Extremities: No edema  Skin: Skin color, texture, turgor normal, no rashes or lesions on visualized portions of skin  Neurologic: No gross deficits     Diagnostics: None done  PROCEDURES:  Patient received the following doses every hour: Step 1:  0.41ml - 1:1,000,000 dilution (silver vial) Step 2:  0.15ml - 1:1,000,000 dilution (silver vial) Step 3: 0.48ml - 1:100,000 dilution (  blue vial)  Step 4: 0.56ml - 1:100,000 dilution (blue vial)  Step 5: 0.36ml - 1:10,000 dilution (gold vial) Step 6: 0.27ml - 1:10,000 dilution (gold vial) Step 7: 0.4ml - 1:10,000 dilution (gold vial) Step 8: 0.63ml - 1:10,000 dilution (gold vial)  Patient was observed for 1 hour after the last dose.   Procedure started at 8:59 Procedure  ended at 3:59   ASSESSMENT/PLAN:   Patient has tolerated the rapid desensitization protocol.  Next appointment: Start at 0.32ml of 1:1000 dilution (green vial) and build up per protocol.

## 2021-08-28 ENCOUNTER — Encounter (HOSPITAL_BASED_OUTPATIENT_CLINIC_OR_DEPARTMENT_OTHER): Payer: Self-pay | Admitting: Nurse Practitioner

## 2021-08-30 ENCOUNTER — Other Ambulatory Visit: Payer: BC Managed Care – PPO

## 2021-09-04 ENCOUNTER — Ambulatory Visit (INDEPENDENT_AMBULATORY_CARE_PROVIDER_SITE_OTHER): Payer: BC Managed Care – PPO

## 2021-09-04 DIAGNOSIS — J309 Allergic rhinitis, unspecified: Secondary | ICD-10-CM

## 2021-09-11 ENCOUNTER — Ambulatory Visit (INDEPENDENT_AMBULATORY_CARE_PROVIDER_SITE_OTHER): Payer: BC Managed Care – PPO

## 2021-09-11 DIAGNOSIS — J309 Allergic rhinitis, unspecified: Secondary | ICD-10-CM | POA: Diagnosis not present

## 2021-09-14 ENCOUNTER — Other Ambulatory Visit (HOSPITAL_BASED_OUTPATIENT_CLINIC_OR_DEPARTMENT_OTHER): Payer: Self-pay

## 2021-09-17 ENCOUNTER — Other Ambulatory Visit (HOSPITAL_BASED_OUTPATIENT_CLINIC_OR_DEPARTMENT_OTHER): Payer: Self-pay

## 2021-09-18 ENCOUNTER — Ambulatory Visit
Admission: RE | Admit: 2021-09-18 | Discharge: 2021-09-18 | Disposition: A | Discharge status: Home / Self Care | Payer: BC Managed Care – PPO | Source: Ambulatory Visit | Attending: Nurse Practitioner | Admitting: Nurse Practitioner

## 2021-09-18 ENCOUNTER — Ambulatory Visit (INDEPENDENT_AMBULATORY_CARE_PROVIDER_SITE_OTHER): Payer: BC Managed Care – PPO

## 2021-09-18 DIAGNOSIS — J309 Allergic rhinitis, unspecified: Secondary | ICD-10-CM | POA: Diagnosis not present

## 2021-09-18 DIAGNOSIS — J189 Pneumonia, unspecified organism: Secondary | ICD-10-CM

## 2021-09-18 DIAGNOSIS — J984 Other disorders of lung: Secondary | ICD-10-CM

## 2021-09-20 NOTE — Progress Notes (Signed)
Please notify patient that her CT chest showed resolving pneumonia and her lungs look significantly improved. She still has a small area of consolidation, which is not uncommon with a pneumonia as severe as her's, and can take time to clear on imaging. We will plan to repeat her CT chest in 3 months to ensure complete resolution. Please schedule her for follow up after. Thanks!

## 2021-09-25 ENCOUNTER — Ambulatory Visit (INDEPENDENT_AMBULATORY_CARE_PROVIDER_SITE_OTHER): Payer: BC Managed Care – PPO

## 2021-09-25 DIAGNOSIS — J309 Allergic rhinitis, unspecified: Secondary | ICD-10-CM

## 2021-09-25 NOTE — Progress Notes (Signed)
ATC x2.  LVM to return call.

## 2021-09-26 ENCOUNTER — Encounter: Payer: Self-pay | Admitting: *Deleted

## 2021-09-26 NOTE — Progress Notes (Signed)
Unable to reach letter sent

## 2021-10-02 ENCOUNTER — Ambulatory Visit (INDEPENDENT_AMBULATORY_CARE_PROVIDER_SITE_OTHER): Payer: BC Managed Care – PPO

## 2021-10-02 DIAGNOSIS — J309 Allergic rhinitis, unspecified: Secondary | ICD-10-CM | POA: Diagnosis not present

## 2021-10-05 ENCOUNTER — Encounter (HOSPITAL_BASED_OUTPATIENT_CLINIC_OR_DEPARTMENT_OTHER): Payer: Self-pay | Admitting: Nurse Practitioner

## 2021-10-15 ENCOUNTER — Encounter (HOSPITAL_BASED_OUTPATIENT_CLINIC_OR_DEPARTMENT_OTHER): Payer: Self-pay | Admitting: Nurse Practitioner

## 2021-10-15 ENCOUNTER — Other Ambulatory Visit (HOSPITAL_BASED_OUTPATIENT_CLINIC_OR_DEPARTMENT_OTHER): Payer: Self-pay

## 2021-10-15 DIAGNOSIS — Z8632 Personal history of gestational diabetes: Secondary | ICD-10-CM

## 2021-10-15 MED ORDER — SEMAGLUTIDE-WEIGHT MANAGEMENT 1 MG/0.5ML ~~LOC~~ SOAJ
1.0000 mg | SUBCUTANEOUS | 0 refills | Status: DC
  Filled 2021-10-15: qty 2, 28d supply, fill #0

## 2021-10-16 ENCOUNTER — Ambulatory Visit (INDEPENDENT_AMBULATORY_CARE_PROVIDER_SITE_OTHER): Payer: BC Managed Care – PPO

## 2021-10-16 DIAGNOSIS — J309 Allergic rhinitis, unspecified: Secondary | ICD-10-CM | POA: Diagnosis not present

## 2021-10-17 ENCOUNTER — Other Ambulatory Visit: Payer: BC Managed Care – PPO

## 2021-10-17 ENCOUNTER — Ambulatory Visit (INDEPENDENT_AMBULATORY_CARE_PROVIDER_SITE_OTHER): Payer: BC Managed Care – PPO

## 2021-10-17 DIAGNOSIS — R0609 Other forms of dyspnea: Secondary | ICD-10-CM

## 2021-10-17 LAB — D-DIMER, QUANTITATIVE: D-Dimer, Quant: 0.47 mcg/mL FEU (ref <0.50)

## 2021-10-17 NOTE — Telephone Encounter (Signed)
Do CXR 10/17/2021 and also d-dimer . If not today then tomorrow. If bad go to ER. 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

## 2021-10-17 NOTE — Telephone Encounter (Signed)
Hello! You may remember I had very bad pneumonia and was seen over the summer. I had a CT in September and it showed I still have pneumonia. In the past couple of days my lower left lung has been aching. Not sharp pains, but I am concerned that something is still going on with my pneumonia. Do you have a suggestion? Do you think I should be seen or given an antibiotic? Thank you so much!  MR, please advise. Emily Sharp is unavailable.

## 2021-10-18 NOTE — Telephone Encounter (Signed)
Will forward to MR regarding the CXR results. Thanks.

## 2021-10-19 NOTE — Telephone Encounter (Signed)
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 No blood clot  But we still do not understand her symptoms She might need an OV with PCP Early, Emily Pesa, NP or APP for symptoms next 1-2 weeks   Latest Reference Range & Units 10/17/21 15:38  D-Dimer, Quant <0.50 mcg/mL FEU 0.47    DG Chest 2 View  Result Date: 10/18/2021 CLINICAL DATA:  Chest discomfort EXAM: CHEST - 2 VIEW COMPARISON:  07/20/2021 FINDINGS: The heart size and mediastinal contours are within normal limits. Both lungs are clear. The visualized skeletal structures are unremarkable except for similar scoliosis of the spine. IMPRESSION: No active cardiopulmonary disease. Electronically Signed   By: Jerilynn Mages.  Shick M.D.   On: 10/18/2021 16:51    CXR J[place]y 2023 Persistent although improved opacity within the left lung base. Correlating with the prior chest CT of 07/05/2021, this may reflect resolving cavitary pneumonia. However, radiographic follow-up to complete resolution is recommended to exclude underlying malignancy at this site.     Electronically Signed   By: Kellie Simmering D.O.   On: 07/20/2021 10:06

## 2021-10-23 ENCOUNTER — Ambulatory Visit (INDEPENDENT_AMBULATORY_CARE_PROVIDER_SITE_OTHER): Payer: BC Managed Care – PPO

## 2021-10-23 DIAGNOSIS — J309 Allergic rhinitis, unspecified: Secondary | ICD-10-CM

## 2021-10-30 ENCOUNTER — Ambulatory Visit (INDEPENDENT_AMBULATORY_CARE_PROVIDER_SITE_OTHER): Payer: BC Managed Care – PPO

## 2021-10-30 DIAGNOSIS — J309 Allergic rhinitis, unspecified: Secondary | ICD-10-CM

## 2021-11-06 ENCOUNTER — Ambulatory Visit (INDEPENDENT_AMBULATORY_CARE_PROVIDER_SITE_OTHER): Payer: BC Managed Care – PPO

## 2021-11-06 DIAGNOSIS — J309 Allergic rhinitis, unspecified: Secondary | ICD-10-CM | POA: Diagnosis not present

## 2021-11-13 ENCOUNTER — Ambulatory Visit (INDEPENDENT_AMBULATORY_CARE_PROVIDER_SITE_OTHER): Payer: BC Managed Care – PPO

## 2021-11-13 DIAGNOSIS — J309 Allergic rhinitis, unspecified: Secondary | ICD-10-CM

## 2021-11-19 ENCOUNTER — Other Ambulatory Visit (HOSPITAL_BASED_OUTPATIENT_CLINIC_OR_DEPARTMENT_OTHER): Payer: Self-pay

## 2021-11-19 ENCOUNTER — Ambulatory Visit (INDEPENDENT_AMBULATORY_CARE_PROVIDER_SITE_OTHER): Payer: BC Managed Care – PPO | Admitting: Nurse Practitioner

## 2021-11-19 DIAGNOSIS — Z6841 Body Mass Index (BMI) 40.0 and over, adult: Secondary | ICD-10-CM | POA: Diagnosis not present

## 2021-11-19 DIAGNOSIS — Z8632 Personal history of gestational diabetes: Secondary | ICD-10-CM

## 2021-11-19 MED ORDER — SEMAGLUTIDE-WEIGHT MANAGEMENT 1 MG/0.5ML ~~LOC~~ SOAJ
1.0000 mg | SUBCUTANEOUS | 6 refills | Status: DC
  Filled 2021-11-19: qty 2, 28d supply, fill #0
  Filled 2021-12-15 – 2021-12-19 (×2): qty 2, 28d supply, fill #1

## 2021-11-19 MED ORDER — SEMAGLUTIDE-WEIGHT MANAGEMENT 1 MG/0.5ML ~~LOC~~ SOAJ: 1.0000 mg | SUBCUTANEOUS | 6 refills | Status: DC

## 2021-11-19 NOTE — Progress Notes (Signed)
Virtual Visit Encounter telephone visit.   I connected with  Livingston Diones on 01/04/22 at  1:10 PM EST by secure audio telemedicine application. I verified that I am speaking with the correct person using two identifiers.   I introduced myself as a Publishing rights manager with the practice. The limitations of evaluation and management by telemedicine discussed with the patient and the availability of in person appointments. The patient expressed verbal understanding and consent to proceed.  Participating parties in this visit include: Myself and patient  The patient is: Patient Location: Home I am: Provider Location: Office/Clinic Subjective:    CC and HPI: Mabell Esguerra is a 41 y.o. year old female presenting for follow up of weight management. Patient reports the following:  Weight Management Sharee tells me she has lost 24 lbs in the last 3 months since starting Wegovy. She tells me her energy is great and she is feeling so good on the medication. She is working on her diet and exercise and has created a routine and finds it easy to settle into this. She is not having side effects of the medication. She would like to continue.     Past medical history, Surgical history, Family history not pertinant except as noted below, Social history, Allergies, and medications have been entered into the medical record, reviewed, and corrections made.   Review of Systems:  All review of systems negative except what is listed in the HPI  Objective:    Alert and oriented x 4 Speaking in clear sentences with no shortness of breath. No distress.  Impression and Recommendations:    Problem List Items Addressed This Visit     BMI 40.0-44.9, adult (HCC) - Primary    24 lb weight loss with excellent diet and exercise management. She is doing very well. We discussed the course of medication progression. I do not recommend increasing dose until the current dose is no longer effective. This will  help prevent side effects. We will plan to follow-up in 6 months as long as she continues to do well.       Relevant Medications   Semaglutide-Weight Management 1 MG/0.5ML SOAJ   History of gestational diabetes   Relevant Medications   Semaglutide-Weight Management 1 MG/0.5ML SOAJ    orders and follow up as documented in EMR I discussed the assessment and treatment plan with the patient. The patient was provided an opportunity to ask questions and all were answered. The patient agreed with the plan and demonstrated an understanding of the instructions.   The patient was advised to call back or seek an in-person evaluation if the symptoms worsen or if the condition fails to improve as anticipated.  Follow-Up: in 6 months  I provided 18 minutes of non-face-to-face interaction with this non face-to-face encounter including intake, same-day documentation, and chart review.   Tollie Eth, NP , DNP, AGNP-c [facility] at [facility] [phone] (fax)

## 2021-11-20 ENCOUNTER — Ambulatory Visit (INDEPENDENT_AMBULATORY_CARE_PROVIDER_SITE_OTHER): Payer: BC Managed Care – PPO

## 2021-11-20 DIAGNOSIS — J309 Allergic rhinitis, unspecified: Secondary | ICD-10-CM | POA: Diagnosis not present

## 2021-12-04 ENCOUNTER — Ambulatory Visit (INDEPENDENT_AMBULATORY_CARE_PROVIDER_SITE_OTHER): Payer: BC Managed Care – PPO

## 2021-12-04 DIAGNOSIS — J309 Allergic rhinitis, unspecified: Secondary | ICD-10-CM

## 2021-12-11 ENCOUNTER — Ambulatory Visit (INDEPENDENT_AMBULATORY_CARE_PROVIDER_SITE_OTHER): Payer: BC Managed Care – PPO

## 2021-12-11 DIAGNOSIS — J309 Allergic rhinitis, unspecified: Secondary | ICD-10-CM

## 2021-12-15 ENCOUNTER — Other Ambulatory Visit (HOSPITAL_BASED_OUTPATIENT_CLINIC_OR_DEPARTMENT_OTHER): Payer: Self-pay

## 2021-12-18 ENCOUNTER — Ambulatory Visit (INDEPENDENT_AMBULATORY_CARE_PROVIDER_SITE_OTHER): Payer: BC Managed Care – PPO

## 2021-12-18 DIAGNOSIS — J309 Allergic rhinitis, unspecified: Secondary | ICD-10-CM | POA: Diagnosis not present

## 2021-12-24 ENCOUNTER — Telehealth: Payer: BC Managed Care – PPO | Admitting: Family

## 2021-12-24 DIAGNOSIS — Z20828 Contact with and (suspected) exposure to other viral communicable diseases: Secondary | ICD-10-CM | POA: Diagnosis not present

## 2021-12-24 MED ORDER — OSELTAMIVIR PHOSPHATE 75 MG PO CAPS: 75.0000 mg | ORAL_CAPSULE | Freq: Every day | ORAL | 0 refills | Status: DC

## 2021-12-24 NOTE — Progress Notes (Unsigned)
E visit for Flu like symptoms   We are sorry that you are not feeling well.  Here is how we plan to help! Based on what you have shared with me it looks like you may have possible exposure to a virus that causes influenza.  Influenza or "the flu" is   an infection caused by a respiratory virus. The flu virus is highly contagious and persons who did not receive their yearly flu vaccination may "catch" the flu from close contact.  We have anti-viral medications to treat the viruses that cause this infection. They are not a "cure" and only shorten the course of the infection. These prescriptions are most effective when they are given within the first 2 days of "flu" symptoms. Antiviral medication are indicated if you have a high risk of complications from the flu. You should  also consider an antiviral medication if you are in close contact with someone who is at risk. These medications can help patients avoid complications from the flu  but have side effects that you should know. Possible side effects from Tamiflu or oseltamivir include nausea, vomiting, diarrhea, dizziness, headaches, eye redness, sleep problems or other respiratory symptoms. You should not take Tamiflu if you have an allergy to oseltamivir or any to the ingredients in Tamiflu.  Based upon your symptoms and potential risk factors I have prescribed Oseltamivir (Tamiflu).  It has been sent to your designated pharmacy.  You will take one 75 mg capsule once a day for 10 days. If you develop symptoms increase to twice a day until completed.   ANYONE WHO HAS FLU SYMPTOMS SHOULD: Stay home. The flu is highly contagious and going out or to work exposes others! Be sure to drink plenty of fluids. Water is fine as well as fruit juices, sodas and electrolyte beverages. You may want to stay away from caffeine or alcohol. If you are nauseated, try taking small sips of liquids. How do you know if you are getting enough fluid? Your urine should be a  pale yellow or almost colorless. Get rest. Taking a steamy shower or using a humidifier may help nasal congestion and ease sore throat pain. Using a saline nasal spray works much the same way. Cough drops, hard candies and sore throat lozenges may ease your cough. Line up a caregiver. Have someone check on you regularly.   GET HELP RIGHT AWAY IF: You cannot keep down liquids or your medications. You become short of breath Your fell like you are going to pass out or loose consciousness. Your symptoms persist after you have completed your treatment plan MAKE SURE YOU  Understand these instructions. Will watch your condition. Will get help right away if you are not doing well or get worse.  Your e-visit answers were reviewed by a board certified advanced clinical practitioner to complete your personal care plan.  Depending on the condition, your plan could have included both over the counter or prescription medications.  If there is a problem please reply  once you have received a response from your provider.  Your safety is important to [place].  If you have drug allergies check your prescription carefully.    You can use MyChart to ask questions about today's visit, request a non-urgent call back, or ask for a work or school excuse for 24 hours related to this e-Visit. If it has been greater than 24 hours you will need to follow up with your provider, or enter a new e-Visit to address those  concerns.  You will get an e-mail in the next two days asking about your experience.  I hope that your e-visit has been valuable and will speed your recovery. Thank you for using e-visits.  Approximately 5 minutes was spent documenting and reviewing patient's chart.

## 2021-12-25 ENCOUNTER — Other Ambulatory Visit: Payer: Self-pay

## 2021-12-26 ENCOUNTER — Other Ambulatory Visit: Payer: Self-pay | Admitting: Family

## 2022-01-04 ENCOUNTER — Encounter (HOSPITAL_BASED_OUTPATIENT_CLINIC_OR_DEPARTMENT_OTHER): Payer: Self-pay | Admitting: Nurse Practitioner

## 2022-01-04 NOTE — Assessment & Plan Note (Signed)
24 lb weight loss with excellent diet and exercise management. She is doing very well. We discussed the course of medication progression. I do not recommend increasing dose until the current dose is no longer effective. This will help prevent side effects. We will plan to follow-up in 6 months as long as she continues to do well.

## 2022-01-22 ENCOUNTER — Ambulatory Visit (INDEPENDENT_AMBULATORY_CARE_PROVIDER_SITE_OTHER): Payer: BC Managed Care – PPO

## 2022-01-22 DIAGNOSIS — J309 Allergic rhinitis, unspecified: Secondary | ICD-10-CM

## 2022-01-29 ENCOUNTER — Ambulatory Visit (INDEPENDENT_AMBULATORY_CARE_PROVIDER_SITE_OTHER): Payer: BC Managed Care – PPO

## 2022-01-29 DIAGNOSIS — J309 Allergic rhinitis, unspecified: Secondary | ICD-10-CM

## 2022-02-05 ENCOUNTER — Ambulatory Visit (INDEPENDENT_AMBULATORY_CARE_PROVIDER_SITE_OTHER): Payer: BC Managed Care – PPO

## 2022-02-05 DIAGNOSIS — J309 Allergic rhinitis, unspecified: Secondary | ICD-10-CM | POA: Diagnosis not present

## 2022-02-19 ENCOUNTER — Ambulatory Visit (INDEPENDENT_AMBULATORY_CARE_PROVIDER_SITE_OTHER): Payer: BC Managed Care – PPO

## 2022-02-19 DIAGNOSIS — J309 Allergic rhinitis, unspecified: Secondary | ICD-10-CM

## 2022-02-20 ENCOUNTER — Encounter: Payer: Self-pay | Admitting: Nurse Practitioner

## 2022-02-21 ENCOUNTER — Other Ambulatory Visit: Payer: Self-pay | Admitting: Nurse Practitioner

## 2022-02-21 DIAGNOSIS — R052 Subacute cough: Secondary | ICD-10-CM

## 2022-02-26 ENCOUNTER — Ambulatory Visit (INDEPENDENT_AMBULATORY_CARE_PROVIDER_SITE_OTHER): Payer: BC Managed Care – PPO

## 2022-02-26 DIAGNOSIS — J309 Allergic rhinitis, unspecified: Secondary | ICD-10-CM

## 2022-03-01 ENCOUNTER — Telehealth: Payer: BC Managed Care – PPO | Admitting: Physician Assistant

## 2022-03-01 DIAGNOSIS — R3989 Other symptoms and signs involving the genitourinary system: Secondary | ICD-10-CM

## 2022-03-01 MED ORDER — NITROFURANTOIN MONOHYD MACRO 100 MG PO CAPS: 100.0000 mg | ORAL_CAPSULE | Freq: Two times a day (BID) | ORAL | 0 refills | Status: DC

## 2022-03-01 NOTE — Progress Notes (Signed)
E-Visit for Urinary Problems  We are sorry that you are not feeling well.  Here is how we plan to help!  Based on what you shared with me it looks like you most likely have a simple urinary tract infection.  A UTI (Urinary Tract Infection) is a bacterial infection of the bladder.  Most cases of urinary tract infections are simple to treat but a key part of your care is to encourage you to drink plenty of fluids and watch your symptoms carefully.  I have prescribed MacroBid 100 mg twice a day for 5 days.  Your symptoms should gradually improve. Call us if the burning in your urine worsens, you develop worsening fever, back pain or pelvic pain or if your symptoms do not resolve after completing the antibiotic.  Urinary tract infections can be prevented by drinking plenty of water to keep your body hydrated.  Also be sure when you wipe, wipe from front to back and don't hold it in!  If possible, empty your bladder every 4 hours.  HOME CARE Drink plenty of fluids Compete the full course of the antibiotics even if the symptoms resolve Remember, when you need to go.go. Holding in your urine can increase the likelihood of getting a UTI! GET HELP RIGHT AWAY IF: You cannot urinate You get a high fever Worsening back pain occurs You see blood in your urine You feel sick to your stomach or throw up You feel like you are going to pass out  MAKE SURE YOU  Understand these instructions. Will watch your condition. Will get help right away if you are not doing well or get worse.   Thank you for choosing an e-visit.  Your e-visit answers were reviewed by a board certified advanced clinical practitioner to complete your personal care plan. Depending upon the condition, your plan could have included both over the counter or prescription medications.  Please review your pharmacy choice. Make sure the pharmacy is open so you can pick up prescription now. If there is a problem, you may contact your  provider through [employer] and have the prescription routed to another pharmacy.  Your safety is important to [place]. If you have drug allergies check your prescription carefully.   For the next 24 hours you can use MyChart to ask questions about today's visit, request a non-urgent call back, or ask for a work or school excuse. You will get an email in the next two days asking about your experience. I hope that your e-visit has been valuable and will speed your recovery.  I have spent 5 minutes in review of e-visit questionnaire, review and updating patient chart, medical decision making and response to patient.   Mar Daring, PA-C

## 2022-03-05 ENCOUNTER — Ambulatory Visit (INDEPENDENT_AMBULATORY_CARE_PROVIDER_SITE_OTHER): Payer: BC Managed Care – PPO

## 2022-03-05 DIAGNOSIS — J309 Allergic rhinitis, unspecified: Secondary | ICD-10-CM

## 2022-03-12 ENCOUNTER — Ambulatory Visit (INDEPENDENT_AMBULATORY_CARE_PROVIDER_SITE_OTHER): Payer: BC Managed Care – PPO

## 2022-03-12 DIAGNOSIS — J309 Allergic rhinitis, unspecified: Secondary | ICD-10-CM

## 2022-03-19 ENCOUNTER — Ambulatory Visit (INDEPENDENT_AMBULATORY_CARE_PROVIDER_SITE_OTHER): Payer: BC Managed Care – PPO

## 2022-03-19 DIAGNOSIS — J309 Allergic rhinitis, unspecified: Secondary | ICD-10-CM | POA: Diagnosis not present

## 2022-03-20 DIAGNOSIS — J3089 Other allergic rhinitis: Secondary | ICD-10-CM | POA: Diagnosis not present

## 2022-03-20 NOTE — Progress Notes (Signed)
VIAL EXP 03-20-23

## 2022-03-28 ENCOUNTER — Ambulatory Visit (INDEPENDENT_AMBULATORY_CARE_PROVIDER_SITE_OTHER): Payer: BC Managed Care – PPO

## 2022-03-28 DIAGNOSIS — J309 Allergic rhinitis, unspecified: Secondary | ICD-10-CM

## 2022-04-09 ENCOUNTER — Ambulatory Visit (INDEPENDENT_AMBULATORY_CARE_PROVIDER_SITE_OTHER): Payer: BC Managed Care – PPO

## 2022-04-09 DIAGNOSIS — J309 Allergic rhinitis, unspecified: Secondary | ICD-10-CM

## 2022-04-16 ENCOUNTER — Ambulatory Visit (INDEPENDENT_AMBULATORY_CARE_PROVIDER_SITE_OTHER): Payer: BC Managed Care – PPO

## 2022-04-16 DIAGNOSIS — J309 Allergic rhinitis, unspecified: Secondary | ICD-10-CM

## 2022-04-23 ENCOUNTER — Ambulatory Visit (INDEPENDENT_AMBULATORY_CARE_PROVIDER_SITE_OTHER): Payer: BC Managed Care – PPO

## 2022-04-23 DIAGNOSIS — J309 Allergic rhinitis, unspecified: Secondary | ICD-10-CM | POA: Diagnosis not present

## 2022-04-30 ENCOUNTER — Ambulatory Visit (INDEPENDENT_AMBULATORY_CARE_PROVIDER_SITE_OTHER): Payer: BC Managed Care – PPO

## 2022-04-30 DIAGNOSIS — J309 Allergic rhinitis, unspecified: Secondary | ICD-10-CM

## 2022-05-07 ENCOUNTER — Ambulatory Visit (INDEPENDENT_AMBULATORY_CARE_PROVIDER_SITE_OTHER): Payer: BC Managed Care – PPO

## 2022-05-07 DIAGNOSIS — J309 Allergic rhinitis, unspecified: Secondary | ICD-10-CM | POA: Diagnosis not present

## 2022-05-14 ENCOUNTER — Ambulatory Visit (INDEPENDENT_AMBULATORY_CARE_PROVIDER_SITE_OTHER): Payer: BC Managed Care – PPO

## 2022-05-14 DIAGNOSIS — J309 Allergic rhinitis, unspecified: Secondary | ICD-10-CM

## 2022-05-19 ENCOUNTER — Other Ambulatory Visit: Payer: Self-pay | Admitting: Nurse Practitioner

## 2022-05-19 DIAGNOSIS — J3081 Allergic rhinitis due to animal (cat) (dog) hair and dander: Secondary | ICD-10-CM

## 2022-05-21 ENCOUNTER — Ambulatory Visit (INDEPENDENT_AMBULATORY_CARE_PROVIDER_SITE_OTHER): Payer: BC Managed Care – PPO

## 2022-05-21 DIAGNOSIS — J309 Allergic rhinitis, unspecified: Secondary | ICD-10-CM | POA: Diagnosis not present

## 2022-05-30 ENCOUNTER — Ambulatory Visit (INDEPENDENT_AMBULATORY_CARE_PROVIDER_SITE_OTHER): Payer: BC Managed Care – PPO

## 2022-05-30 DIAGNOSIS — J309 Allergic rhinitis, unspecified: Secondary | ICD-10-CM

## 2022-06-04 ENCOUNTER — Ambulatory Visit (INDEPENDENT_AMBULATORY_CARE_PROVIDER_SITE_OTHER): Payer: BC Managed Care – PPO

## 2022-06-04 DIAGNOSIS — J309 Allergic rhinitis, unspecified: Secondary | ICD-10-CM

## 2022-06-10 DIAGNOSIS — J3081 Allergic rhinitis due to animal (cat) (dog) hair and dander: Secondary | ICD-10-CM

## 2022-06-10 NOTE — Progress Notes (Signed)
VIAL EXP 06-10-23.  LS

## 2022-06-18 ENCOUNTER — Ambulatory Visit (INDEPENDENT_AMBULATORY_CARE_PROVIDER_SITE_OTHER): Payer: BC Managed Care – PPO

## 2022-06-18 DIAGNOSIS — J309 Allergic rhinitis, unspecified: Secondary | ICD-10-CM | POA: Diagnosis not present

## 2022-06-25 ENCOUNTER — Ambulatory Visit (INDEPENDENT_AMBULATORY_CARE_PROVIDER_SITE_OTHER): Payer: BC Managed Care – PPO

## 2022-06-25 DIAGNOSIS — J309 Allergic rhinitis, unspecified: Secondary | ICD-10-CM

## 2022-07-02 ENCOUNTER — Ambulatory Visit (INDEPENDENT_AMBULATORY_CARE_PROVIDER_SITE_OTHER): Payer: BC Managed Care – PPO

## 2022-07-02 ENCOUNTER — Ambulatory Visit: Payer: BC Managed Care – PPO | Admitting: Allergy

## 2022-07-02 DIAGNOSIS — J309 Allergic rhinitis, unspecified: Secondary | ICD-10-CM | POA: Diagnosis not present

## 2022-07-09 ENCOUNTER — Ambulatory Visit (INDEPENDENT_AMBULATORY_CARE_PROVIDER_SITE_OTHER): Payer: BC Managed Care – PPO

## 2022-07-09 DIAGNOSIS — J309 Allergic rhinitis, unspecified: Secondary | ICD-10-CM | POA: Diagnosis not present

## 2022-07-16 ENCOUNTER — Ambulatory Visit (INDEPENDENT_AMBULATORY_CARE_PROVIDER_SITE_OTHER): Payer: BC Managed Care – PPO

## 2022-07-16 DIAGNOSIS — J309 Allergic rhinitis, unspecified: Secondary | ICD-10-CM

## 2022-07-21 ENCOUNTER — Other Ambulatory Visit (HOSPITAL_BASED_OUTPATIENT_CLINIC_OR_DEPARTMENT_OTHER): Payer: Self-pay | Admitting: Nurse Practitioner

## 2022-07-21 DIAGNOSIS — F32A Depression, unspecified: Secondary | ICD-10-CM

## 2022-07-22 NOTE — Telephone Encounter (Signed)
 Refill request last apt 11/09/21

## 2022-07-23 ENCOUNTER — Ambulatory Visit (INDEPENDENT_AMBULATORY_CARE_PROVIDER_SITE_OTHER): Payer: BC Managed Care – PPO

## 2022-07-23 DIAGNOSIS — J309 Allergic rhinitis, unspecified: Secondary | ICD-10-CM | POA: Diagnosis not present

## 2022-07-30 ENCOUNTER — Ambulatory Visit (INDEPENDENT_AMBULATORY_CARE_PROVIDER_SITE_OTHER): Payer: BC Managed Care – PPO

## 2022-07-30 DIAGNOSIS — J309 Allergic rhinitis, unspecified: Secondary | ICD-10-CM | POA: Diagnosis not present

## 2022-08-06 ENCOUNTER — Ambulatory Visit (INDEPENDENT_AMBULATORY_CARE_PROVIDER_SITE_OTHER): Payer: BC Managed Care – PPO

## 2022-08-06 DIAGNOSIS — J309 Allergic rhinitis, unspecified: Secondary | ICD-10-CM

## 2022-08-20 ENCOUNTER — Ambulatory Visit (INDEPENDENT_AMBULATORY_CARE_PROVIDER_SITE_OTHER): Payer: BC Managed Care – PPO

## 2022-08-20 DIAGNOSIS — J309 Allergic rhinitis, unspecified: Secondary | ICD-10-CM

## 2022-08-26 ENCOUNTER — Other Ambulatory Visit: Payer: Self-pay | Admitting: Obstetrics and Gynecology

## 2022-08-26 DIAGNOSIS — R928 Other abnormal and inconclusive findings on diagnostic imaging of breast: Secondary | ICD-10-CM

## 2022-08-27 ENCOUNTER — Ambulatory Visit (INDEPENDENT_AMBULATORY_CARE_PROVIDER_SITE_OTHER): Payer: BC Managed Care – PPO | Admitting: *Deleted

## 2022-08-27 DIAGNOSIS — J309 Allergic rhinitis, unspecified: Secondary | ICD-10-CM

## 2022-09-10 ENCOUNTER — Ambulatory Visit (INDEPENDENT_AMBULATORY_CARE_PROVIDER_SITE_OTHER): Payer: BC Managed Care – PPO | Admitting: *Deleted

## 2022-09-10 DIAGNOSIS — J309 Allergic rhinitis, unspecified: Secondary | ICD-10-CM | POA: Diagnosis not present

## 2022-09-10 NOTE — Progress Notes (Signed)
VIAL EXP 09-10-23

## 2022-09-11 DIAGNOSIS — J3081 Allergic rhinitis due to animal (cat) (dog) hair and dander: Secondary | ICD-10-CM | POA: Diagnosis not present

## 2022-09-12 ENCOUNTER — Ambulatory Visit
Admission: RE | Admit: 2022-09-12 | Discharge: 2022-09-12 | Disposition: A | Discharge status: Home / Self Care | Payer: BC Managed Care – PPO | Source: Ambulatory Visit | Attending: Obstetrics and Gynecology | Admitting: Obstetrics and Gynecology

## 2022-09-12 DIAGNOSIS — R928 Other abnormal and inconclusive findings on diagnostic imaging of breast: Secondary | ICD-10-CM

## 2022-09-13 ENCOUNTER — Other Ambulatory Visit: Payer: Self-pay | Admitting: Obstetrics and Gynecology

## 2022-09-13 DIAGNOSIS — N6310 Unspecified lump in the right breast, unspecified quadrant: Secondary | ICD-10-CM

## 2022-09-24 ENCOUNTER — Ambulatory Visit
Admission: RE | Admit: 2022-09-24 | Discharge: 2022-09-24 | Disposition: A | Discharge status: Home / Self Care | Payer: BC Managed Care – PPO | Source: Ambulatory Visit | Attending: Obstetrics and Gynecology | Admitting: Obstetrics and Gynecology

## 2022-09-24 DIAGNOSIS — N6310 Unspecified lump in the right breast, unspecified quadrant: Secondary | ICD-10-CM

## 2022-09-24 HISTORY — PX: BREAST BIOPSY: SHX20

## 2022-10-01 ENCOUNTER — Ambulatory Visit (INDEPENDENT_AMBULATORY_CARE_PROVIDER_SITE_OTHER): Payer: BC Managed Care – PPO

## 2022-10-01 DIAGNOSIS — J309 Allergic rhinitis, unspecified: Secondary | ICD-10-CM

## 2022-10-15 ENCOUNTER — Ambulatory Visit (INDEPENDENT_AMBULATORY_CARE_PROVIDER_SITE_OTHER): Payer: BC Managed Care – PPO

## 2022-10-15 DIAGNOSIS — J309 Allergic rhinitis, unspecified: Secondary | ICD-10-CM | POA: Diagnosis not present

## 2022-10-20 ENCOUNTER — Other Ambulatory Visit (HOSPITAL_BASED_OUTPATIENT_CLINIC_OR_DEPARTMENT_OTHER): Payer: Self-pay | Admitting: Family Medicine

## 2022-10-20 DIAGNOSIS — F32A Depression, unspecified: Secondary | ICD-10-CM

## 2022-10-29 ENCOUNTER — Ambulatory Visit (INDEPENDENT_AMBULATORY_CARE_PROVIDER_SITE_OTHER): Payer: BC Managed Care – PPO

## 2022-10-29 DIAGNOSIS — J309 Allergic rhinitis, unspecified: Secondary | ICD-10-CM | POA: Diagnosis not present

## 2022-11-05 ENCOUNTER — Ambulatory Visit (INDEPENDENT_AMBULATORY_CARE_PROVIDER_SITE_OTHER): Payer: BC Managed Care – PPO

## 2022-11-05 DIAGNOSIS — J309 Allergic rhinitis, unspecified: Secondary | ICD-10-CM

## 2022-11-12 ENCOUNTER — Ambulatory Visit (INDEPENDENT_AMBULATORY_CARE_PROVIDER_SITE_OTHER): Payer: BC Managed Care – PPO

## 2022-11-12 DIAGNOSIS — J309 Allergic rhinitis, unspecified: Secondary | ICD-10-CM | POA: Diagnosis not present

## 2022-11-19 ENCOUNTER — Ambulatory Visit (INDEPENDENT_AMBULATORY_CARE_PROVIDER_SITE_OTHER): Payer: BC Managed Care – PPO

## 2022-11-19 DIAGNOSIS — J309 Allergic rhinitis, unspecified: Secondary | ICD-10-CM

## 2022-12-03 ENCOUNTER — Ambulatory Visit (INDEPENDENT_AMBULATORY_CARE_PROVIDER_SITE_OTHER): Payer: BC Managed Care – PPO

## 2022-12-03 DIAGNOSIS — J309 Allergic rhinitis, unspecified: Secondary | ICD-10-CM | POA: Diagnosis not present

## 2022-12-12 ENCOUNTER — Ambulatory Visit: Payer: BC Managed Care – PPO

## 2022-12-12 DIAGNOSIS — J309 Allergic rhinitis, unspecified: Secondary | ICD-10-CM | POA: Diagnosis not present

## 2023-01-08 ENCOUNTER — Telehealth: Payer: Self-pay | Admitting: Physician Assistant

## 2023-01-08 DIAGNOSIS — J208 Acute bronchitis due to other specified organisms: Secondary | ICD-10-CM | POA: Diagnosis not present

## 2023-01-08 MED ORDER — BENZONATATE 100 MG PO CAPS: 100.0000 mg | ORAL_CAPSULE | Freq: Three times a day (TID) | ORAL | 0 refills | Status: DC | PRN

## 2023-01-08 MED ORDER — ALBUTEROL SULFATE HFA 108 (90 BASE) MCG/ACT IN AERS: 1.0000 | INHALATION_SPRAY | Freq: Four times a day (QID) | RESPIRATORY_TRACT | 0 refills | Status: DC | PRN

## 2023-01-08 MED ORDER — PREDNISONE 20 MG PO TABS
40.0000 mg | ORAL_TABLET | Freq: Every day | ORAL | 0 refills | Status: DC
Start: 2023-01-08 — End: 2023-04-02

## 2023-01-08 NOTE — Progress Notes (Signed)
 E-Visit for Cough   We are sorry that you are not feeling well.  Here is how we plan to help!  Based on your presentation I believe you most likely have A cough due to a virus.  This is called viral bronchitis and is best treated by rest, plenty of fluids and control of the cough.  You may use Ibuprofen  or Tylenol  as directed to help your symptoms.     In addition you may use A non-prescription cough medication called Mucinex DM: take 2 tablets every 12 hours. and A prescription cough medication called Tessalon  Perles 100mg . You may take 1-2 capsules every 8 hours as needed for your cough.  I have also prescribed: Prednisone  20mg  Take 2 tablets (40mg ) daily for 5 days.  Albuterol  inhaler Use 1-2 puffs every 6 hours as needed for shortness of breath, chest tightness, and/or wheezing.   From your responses in the eVisit questionnaire you describe inflammation in the upper respiratory tract which is causing a significant cough.  This is commonly called Bronchitis and has four common causes:   Allergies Viral Infections Acid Reflux Bacterial Infection Allergies, viruses and acid reflux are treated by controlling symptoms or eliminating the cause. An example might be a cough caused by taking certain blood pressure medications. You stop the cough by changing the medication. Another example might be a cough caused by acid reflux. Controlling the reflux helps control the cough.  USE OF BRONCHODILATOR (RESCUE) INHALERS: There is a risk from using your bronchodilator too frequently.  The risk is that over-reliance on a medication which only relaxes the muscles surrounding the breathing tubes can reduce the effectiveness of medications prescribed to reduce swelling and congestion of the tubes themselves.  Although you feel brief relief from the bronchodilator inhaler, your asthma may actually be worsening with the tubes becoming more swollen and filled with mucus.  This can delay other crucial  treatments, such as oral steroid medications. If you need to use a bronchodilator inhaler daily, several times per day, you should discuss this with your provider.  There are probably better treatments that could be used to keep your asthma under control.     HOME CARE Only take medications as instructed by your medical team. Complete the entire course of an antibiotic. Drink plenty of fluids and get plenty of rest. Avoid close contacts especially the very young and the elderly Cover your mouth if you cough or cough into your sleeve. Always remember to wash your hands A steam or ultrasonic humidifier can help congestion.   GET HELP RIGHT AWAY IF: You develop worsening fever. You become short of breath You cough up blood. Your symptoms persist after you have completed your treatment plan MAKE SURE YOU  Understand these instructions. Will watch your condition. Will get help right away if you are not doing well or get worse.    Thank you for choosing an e-visit.  Your e-visit answers were reviewed by a board certified advanced clinical practitioner to complete your personal care plan. Depending upon the condition, your plan could have included both over the counter or prescription medications.  Please review your pharmacy choice. Make sure the pharmacy is open so you can pick up prescription now. If there is a problem, you may contact your provider through [employer] and have the prescription routed to another pharmacy.  Your safety is important to us . If you have drug allergies check your prescription carefully.   For the next 24 hours you can  use MyChart to ask questions about today's visit, request a non-urgent call back, or ask for a work or school excuse. You will get an email in the next two days asking about your experience. I hope that your e-visit has been valuable and will speed your recovery.   I have spent 5 minutes in review of e-visit questionnaire, review and  updating patient chart, medical decision making and response to patient.   Delon CHRISTELLA Dickinson, PA-C

## 2023-01-16 ENCOUNTER — Ambulatory Visit (INDEPENDENT_AMBULATORY_CARE_PROVIDER_SITE_OTHER): Payer: 59

## 2023-01-16 DIAGNOSIS — J309 Allergic rhinitis, unspecified: Secondary | ICD-10-CM | POA: Diagnosis not present

## 2023-02-13 ENCOUNTER — Ambulatory Visit: Payer: 59

## 2023-02-13 DIAGNOSIS — J309 Allergic rhinitis, unspecified: Secondary | ICD-10-CM | POA: Diagnosis not present

## 2023-02-26 DIAGNOSIS — J3089 Other allergic rhinitis: Secondary | ICD-10-CM | POA: Diagnosis not present

## 2023-02-26 NOTE — Progress Notes (Signed)
VIAL MADE 02-26-23. EXP 02-26-24.

## 2023-03-13 ENCOUNTER — Ambulatory Visit (INDEPENDENT_AMBULATORY_CARE_PROVIDER_SITE_OTHER)

## 2023-03-13 DIAGNOSIS — J309 Allergic rhinitis, unspecified: Secondary | ICD-10-CM | POA: Diagnosis not present

## 2023-04-02 ENCOUNTER — Telehealth: Admitting: Physician Assistant

## 2023-04-02 DIAGNOSIS — J069 Acute upper respiratory infection, unspecified: Secondary | ICD-10-CM

## 2023-04-02 DIAGNOSIS — J4521 Mild intermittent asthma with (acute) exacerbation: Secondary | ICD-10-CM | POA: Diagnosis not present

## 2023-04-02 MED ORDER — BENZONATATE 100 MG PO CAPS: 100.0000 mg | ORAL_CAPSULE | Freq: Three times a day (TID) | ORAL | 0 refills | Status: DC | PRN

## 2023-04-02 MED ORDER — PREDNISONE 20 MG PO TABS: 40.0000 mg | ORAL_TABLET | Freq: Every day | ORAL | 0 refills | Status: DC

## 2023-04-02 NOTE — Progress Notes (Signed)
 E-Visit for Cough   We are sorry that you are not feeling well.  Here is how we plan to help!  Based on your presentation I believe you most likely have A cough due to a virus.  This is called viral bronchitis and is best treated by rest, plenty of fluids and control of the cough.  You may use Ibuprofen or Tylenol as directed to help your symptoms.     In addition I have prescribed A prescription cough medication called Tessalon Perles 100mg . You may take 1-2 capsules every 8 hours as needed for your cough.. I have also sent in a short course of prednisone to take daily for 5 days.   From your responses in the eVisit questionnaire you describe inflammation in the upper respiratory tract which is causing a significant cough.  This is commonly called Bronchitis and has four common causes:   Allergies Viral Infections Acid Reflux Bacterial Infection Allergies, viruses and acid reflux are treated by controlling symptoms or eliminating the cause. An example might be a cough caused by taking certain blood pressure medications. You stop the cough by changing the medication. Another example might be a cough caused by acid reflux. Controlling the reflux helps control the cough.  USE OF BRONCHODILATOR ("RESCUE") INHALERS: There is a risk from using your bronchodilator too frequently.  The risk is that over-reliance on a medication which only relaxes the muscles surrounding the breathing tubes can reduce the effectiveness of medications prescribed to reduce swelling and congestion of the tubes themselves.  Although you feel brief relief from the bronchodilator inhaler, your asthma may actually be worsening with the tubes becoming more swollen and filled with mucus.  This can delay other crucial treatments, such as oral steroid medications. If you need to use a bronchodilator inhaler daily, several times per day, you should discuss this with your provider.  There are probably better treatments that could be  used to keep your asthma under control.     HOME CARE Only take medications as instructed by your medical team. Complete the entire course of an antibiotic. Drink plenty of fluids and get plenty of rest. Avoid close contacts especially the very young and the elderly Cover your mouth if you cough or cough into your sleeve. Always remember to wash your hands A steam or ultrasonic humidifier can help congestion.   GET HELP RIGHT AWAY IF: You develop worsening fever. You become short of breath You cough up blood. Your symptoms persist after you have completed your treatment plan MAKE SURE YOU  Understand these instructions. Will watch your condition. Will get help right away if you are not doing well or get worse.    Thank you for choosing an e-visit.  Your e-visit answers were reviewed by a board certified advanced clinical practitioner to complete your personal care plan. Depending upon the condition, your plan could have included both over the counter or prescription medications.  Please review your pharmacy choice. Make sure the pharmacy is open so you can pick up prescription now. If there is a problem, you may contact your provider through [employer] and have the prescription routed to another pharmacy.  Your safety is important to [place]. If you have drug allergies check your prescription carefully.   For the next 24 hours you can use MyChart to ask questions about today's visit, request a non-urgent call back, or ask for a work or school excuse. You will get an email in the next two days asking  about your experience. I hope that your e-visit has been valuable and will speed your recovery.

## 2023-04-02 NOTE — Progress Notes (Signed)
 I have spent 5 minutes in review of e-visit questionnaire, review and updating patient chart, medical decision making and response to patient.   Piedad Climes, PA-C

## 2023-04-10 ENCOUNTER — Ambulatory Visit (INDEPENDENT_AMBULATORY_CARE_PROVIDER_SITE_OTHER)

## 2023-04-10 DIAGNOSIS — J309 Allergic rhinitis, unspecified: Secondary | ICD-10-CM | POA: Diagnosis not present

## 2023-04-19 ENCOUNTER — Telehealth: Admitting: Family

## 2023-04-19 DIAGNOSIS — J209 Acute bronchitis, unspecified: Secondary | ICD-10-CM | POA: Diagnosis not present

## 2023-04-19 MED ORDER — BENZONATATE 100 MG PO CAPS: 100.0000 mg | ORAL_CAPSULE | Freq: Three times a day (TID) | ORAL | 0 refills | Status: DC | PRN

## 2023-04-19 MED ORDER — AZITHROMYCIN 250 MG PO TABS: ORAL_TABLET | ORAL | 0 refills | Status: DC

## 2023-04-19 NOTE — Progress Notes (Signed)
 E-Visit for Cough  We are sorry that you are not feeling well.  Here is how we plan to help!  Based on your presentation I believe you most likely have A cough due to bacteria.  When patients have a fever and a productive cough with a change in color or increased sputum production, we are concerned about bacterial bronchitis.  If left untreated it can progress to pneumonia.  If your symptoms do not improve with your treatment plan it is important that you contact your provider.   I have prescribed Azithromyin 250 mg: two tablets now and then one tablet daily for 4 additonal days    In addition you may use A non-prescription cough medication called Robitussin DAC. Take 2 teaspoons every 8 hours or Delsym: take 2 teaspoons every 12 hours., A non-prescription cough medication called Mucinex DM: take 2 tablets every 12 hours., and A prescription cough medication called Tessalon Perles 100mg . You may take 1-2 capsules every 8 hours as needed for your cough.    From your responses in the eVisit questionnaire you describe inflammation in the upper respiratory tract which is causing a significant cough.  This is commonly called Bronchitis and has four common causes:   Allergies Viral Infections Acid Reflux Bacterial Infection Allergies, viruses and acid reflux are treated by controlling symptoms or eliminating the cause. An example might be a cough caused by taking certain blood pressure medications. You stop the cough by changing the medication. Another example might be a cough caused by acid reflux. Controlling the reflux helps control the cough.  USE OF BRONCHODILATOR ("RESCUE") INHALERS: There is a risk from using your bronchodilator too frequently.  The risk is that over-reliance on a medication which only relaxes the muscles surrounding the breathing tubes can reduce the effectiveness of medications prescribed to reduce swelling and congestion of the tubes themselves.  Although you feel brief relief  from the bronchodilator inhaler, your asthma may actually be worsening with the tubes becoming more swollen and filled with mucus.  This can delay other crucial treatments, such as oral steroid medications. If you need to use a bronchodilator inhaler daily, several times per day, you should discuss this with your provider.  There are probably better treatments that could be used to keep your asthma under control.     HOME CARE Only take medications as instructed by your medical team. Complete the entire course of an antibiotic. Drink plenty of fluids and get plenty of rest. Avoid close contacts especially the very young and the elderly Cover your mouth if you cough or cough into your sleeve. Always remember to wash your hands A steam or ultrasonic humidifier can help congestion.   GET HELP RIGHT AWAY IF: You develop worsening fever. You become short of breath You cough up blood. Your symptoms persist after you have completed your treatment plan MAKE SURE YOU  Understand these instructions. Will watch your condition. Will get help right away if you are not doing well or get worse.    Thank you for choosing an e-visit.  Your e-visit answers were reviewed by a board certified advanced clinical practitioner to complete your personal care plan. Depending upon the condition, your plan could have included both over the counter or prescription medications.  Please review your pharmacy choice. Make sure the pharmacy is open so you can pick up prescription now. If there is a problem, you may contact your provider through [employer] and have the prescription routed to another pharmacy.  Your safety is important to [place]. If you have drug allergies check your prescription carefully.   For the next 24 hours you can use MyChart to ask questions about today's visit, request a non-urgent call back, or ask for a work or school excuse. You will get an email in the next two days asking about your  experience. I hope that your e-visit has been valuable and will speed your recovery.  Approximately 5 minutes was spent documenting and reviewing patient's chart.

## 2023-05-08 ENCOUNTER — Ambulatory Visit (INDEPENDENT_AMBULATORY_CARE_PROVIDER_SITE_OTHER)

## 2023-05-08 DIAGNOSIS — J309 Allergic rhinitis, unspecified: Secondary | ICD-10-CM | POA: Diagnosis not present

## 2023-05-31 ENCOUNTER — Other Ambulatory Visit (HOSPITAL_BASED_OUTPATIENT_CLINIC_OR_DEPARTMENT_OTHER): Payer: Self-pay | Admitting: Nurse Practitioner

## 2023-05-31 DIAGNOSIS — F32A Depression, unspecified: Secondary | ICD-10-CM

## 2023-06-02 NOTE — Progress Notes (Unsigned)
 Follow Up Note  RE: Emily Sharp MRN: [id number] DOB: [birth year] Date of Office Visit: 06/03/2023  Referring provider: Annella Kief, NP Primary care provider: Annella Kief, NP  Chief Complaint: No chief complaint on file.  History of Present Illness: I had the pleasure of seeing Emily Sharp for a follow up visit at the [facility] on 06/02/2023. She is a 43 y.o. female, who is being followed for allergic rhinitis on AIT, asthma, atopic dermatitis, drug allergy . Her previous allergy  office visit was on 08/21/2021 with Dr. Jolayne Natter. Today is a regular follow up visit.  Discussed the use of AI scribe software for clinical note transcription with the patient, who gave verbal consent to proceed.  History of Present Illness            Seasonal and perennial rhinitis: Not well controlled  - Testing today showed positive to grass pollen, tree pollen, weed pollen, dust mite, cat, dog, horse - Copy of test results provided.  - Avoidance measures provided. - Continue with: Xyzal (levocetirizine) 5mg  tablet once daily and Flonase  (fluticasone ) one spray per nostril daily - You can use an extra dose of the antihistamine, if needed, for breakthrough symptoms.  - Consider nasal saline rinses 1-2 times daily to remove allergens from the nasal cavities as well as help with mucous clearance (this is especially helpful to do before the nasal sprays are given) Start allergy  injections. You are candidate for Rush immunotherapy or standard buildup, codes provided today to to ensure insurance coverage of RUSH.  If you would like to proceed with please let us  know Had a detailed discussion with patient/family that clinical history is suggestive of allergic rhinitis, and may benefit from allergy  immunotherapy (AIT). Discussed in detail regarding the dosing, schedule, side effects (mild to moderate local allergic reaction and rarely systemic allergic reactions including  anaphylaxis/death), alternatives and benefits (significant improvement in nasal symptoms, seasonal flares of asthma) of immunotherapy with the patient. There is significant time commitment involved with allergy  shots, which includes weekly immunotherapy injections for first 9-12 months and then biweekly to monthly injections for 3-5 years. Clinical response is often delayed and patient may not see an improvement for 6-12 months. Consent was signed. I have prescribed epinephrine  injectable and demonstrated proper use. For mild symptoms you can take over the counter antihistamines such as Benadryl  and monitor symptoms closely. If symptoms worsen or if you have severe symptoms including breathing issues, throat closure, significant swelling, whole body hives, severe diarrhea and vomiting, lightheadedness then inject epinephrine  and seek immediate medical care afterwards. Action plan given.     Atopic Dermatitis:  Daily Care For Maintenance (daily and continue even once eczema controlled) - Recommend hypoallergenic hydrating ointment at least twice daily.  This must be done daily for control of flares. (Great options include Vaseline, CeraVe, Aquaphor, Aveeno, Cetaphil, VaniCream, etc) - Recommend avoiding detergents, soaps or lotions with fragrances/dyes, and instead using products which are hypoallergenic, use second rinse cycle when washing clothes -Wear lose breathable clothing, avoid wool -Avoid extremes of humidity - Limit showers/baths to 5 minutes and use luke warm water instead of hot, pat dry following baths, and apply moisturizer - can use steroid creams as detailed below up to twice weekly for prevention of flares.   For Flares:(add this to maintenance therapy if needed for flares) -Derma-Smoothe  0.01% oil twice daily as needed for flares on scalp and ears     Mild Persistent  Asthma: This may be  resolving bronchospasm from pneumonia or new development of asthma - Breathing test today  showed: Some inflammation in your lungs - However based on breathing test and symptoms and plan to start allergy  injections we will step up treatment today -Hopefully will be able to stepdown in the about 3 months   PLAN:  - Spacer sample and demonstration provided. - Daily controller medication(s): Symbicort  80/4.7mcg two puffs twice daily with spacer - Prior to physical activity: albuterol  2 puffs 10-15 minutes before physical activity. - Rescue medications: albuterol  4 puffs every 4-6 hours as needed - Changes during respiratory infections or worsening symptoms: Increase Symbicort   to 4 puffs twice daily for TWO WEEKS. - Asthma control goals:  * Full participation in all desired activities (may need albuterol  before activity) * Albuterol  use two time or less a week on average (not counting use with activity) * Cough interfering with sleep two time or less a month * Oral steroids no more than once a year * No hospitalizations    Drug Allergy   -Continue to avoid Ancef  and sulfa drugs -We could consider evaluation for Ancef  allergy  in the future, because after 10 years people generally outgrow this allergy  -Sulfa allergy  tends to be more persistent and I would recommend continue avoidance    Assessment and Plan: Emily Sharp is a 43 y.o. female with: *** Assessment and Plan              No follow-ups on file.  No orders of the defined types were placed in this encounter.  Lab Orders  No laboratory test(s) ordered today    Diagnostics: Spirometry:  Tracings reviewed. Her effort: {Blank single:19197::"Good reproducible efforts.","It was hard to get consistent efforts and there is a question as to whether this reflects a maximal maneuver.","Poor effort, data can not be interpreted."} FVC: ***L FEV1: ***L, ***% predicted FEV1/FVC ratio: ***% Interpretation: {Blank single:19197::"Spirometry consistent with mild obstructive disease","Spirometry consistent with moderate  obstructive disease","Spirometry consistent with severe obstructive disease","Spirometry consistent with possible restrictive disease","Spirometry consistent with mixed obstructive and restrictive disease","Spirometry uninterpretable due to technique","Spirometry consistent with normal pattern","No overt abnormalities noted given today's efforts"}.  Please see scanned spirometry results for details.  Skin Testing: {Blank single:19197::"Select foods","Environmental allergy  panel","Environmental allergy  panel and select foods","Food allergy  panel","None","Deferred due to recent antihistamines use"}. *** Results discussed with patient/family.   Medication List:  Current Outpatient Medications  Medication Sig Dispense Refill  . albuterol  (VENTOLIN  HFA) 108 (90 Base) MCG/ACT inhaler Inhale 1-2 puffs into the lungs every 6 (six) hours as needed. 8 g 0  . azithromycin  (ZITHROMAX ) 250 MG tablet Take 500 mg once, then 250 mg for four days 6 tablet 0  . benzonatate  (TESSALON  PERLES) 100 MG capsule Take 1 capsule (100 mg total) by mouth 3 (three) times daily as needed. 20 capsule 0  . budesonide -formoterol  (SYMBICORT ) 80-4.5 MCG/ACT inhaler Inhale 2 puffs into the lungs 2 (two) times daily. 1 each 6  . EPINEPHrine  0.3 mg/0.3 mL IJ SOAJ injection Inject 0.3 mg into the muscle as needed for anaphylaxis. 1 each 1  . Fluocinolone  Acetonide Scalp (DERMA-SMOOTHE /FS SCALP) 0.01 % OIL Apply 1 mL topically 2 (two) times daily. 118.28 mL 1  . FLUoxetine  (PROZAC ) 40 MG capsule TAKE 1 CAPSULE (40 MG TOTAL) BY MOUTH DAILY. START AFTER FINISHING THE 20MG  PRESCRIPTION. 90 capsule 1  . fluticasone  (FLONASE ) 50 MCG/ACT nasal spray Place 1 spray into both nostrils daily. Begin by using 2 sprays in each nare daily for 3 to 5 days, then decrease  to 1 spray in each nare daily. 32 mL 1  . predniSONE  (DELTASONE ) 20 MG tablet Take 2 tablets (40 mg total) by mouth daily with breakfast. 10 tablet 0   No current  facility-administered medications for this visit.   Allergies: Allergies  Allergen Reactions  . Sulfa Antibiotics Hives    HIVES, RASH  . Ancef  [Cefazolin ] Rash and Cough   I reviewed her past medical history, social history, family history, and environmental history and no significant changes have been reported from her previous visit.  Review of Systems  Constitutional:  Negative for appetite change, chills, fever and unexpected weight change.  HENT:  Negative for congestion and rhinorrhea.   Eyes:  Negative for itching.  Respiratory:  Negative for cough, chest tightness, shortness of breath and wheezing.   Cardiovascular:  Negative for chest pain.  Gastrointestinal:  Negative for abdominal pain.  Genitourinary:  Negative for difficulty urinating.  Skin:  Negative for rash.  Allergic/Immunologic: Positive for environmental allergies.  Neurological:  Negative for headaches.   Objective: There were no vitals taken for this visit. There is no height or weight on file to calculate BMI. Physical Exam Vitals and nursing note reviewed.  Constitutional:      Appearance: Normal appearance. She is well-developed.  HENT:     Head: Normocephalic and atraumatic.     Right Ear: Tympanic membrane and external ear normal.     Left Ear: Tympanic membrane and external ear normal.     Nose: Nose normal.     Mouth/Throat:     Mouth: Mucous membranes are moist.     Pharynx: Oropharynx is clear.  Eyes:     Conjunctiva/sclera: Conjunctivae normal.  Cardiovascular:     Rate and Rhythm: Normal rate and regular rhythm.     Heart sounds: Normal heart sounds. No murmur heard.    No friction rub. No gallop.  Pulmonary:     Effort: Pulmonary effort is normal.     Breath sounds: Normal breath sounds. No wheezing, rhonchi or rales.  Musculoskeletal:     Cervical back: Neck supple.  Skin:    General: Skin is warm.     Findings: No rash.  Neurological:     Mental Status: She is alert and  oriented to person, place, and time.  Psychiatric:        Behavior: Behavior normal.  Previous notes and tests were reviewed. The plan was reviewed with the patient/family, and all questions/concerned were addressed.  It was my pleasure to see Emily Sharp today and participate in her care. Please feel free to contact me with any questions or concerns.  Sincerely,  Eudelia Hero, DO Allergy  & Immunology  [facility]  [place] office: [phone] [facility] office: [phone]

## 2023-06-03 ENCOUNTER — Ambulatory Visit: Admitting: Allergy

## 2023-06-03 ENCOUNTER — Other Ambulatory Visit: Payer: Self-pay

## 2023-06-03 ENCOUNTER — Encounter: Payer: Self-pay | Admitting: Allergy

## 2023-06-03 VITALS — BP 120/82 | HR 94 | Temp 97.7°F | Resp 18 | Ht 67.72 in | Wt 269.1 lb

## 2023-06-03 DIAGNOSIS — L2089 Other atopic dermatitis: Secondary | ICD-10-CM

## 2023-06-03 DIAGNOSIS — Z889 Allergy status to unspecified drugs, medicaments and biological substances status: Secondary | ICD-10-CM | POA: Diagnosis not present

## 2023-06-03 DIAGNOSIS — J3089 Other allergic rhinitis: Secondary | ICD-10-CM

## 2023-06-03 DIAGNOSIS — J302 Other seasonal allergic rhinitis: Secondary | ICD-10-CM

## 2023-06-03 DIAGNOSIS — J4520 Mild intermittent asthma, uncomplicated: Secondary | ICD-10-CM | POA: Diagnosis not present

## 2023-06-03 MED ORDER — AIRSUPRA 90-80 MCG/ACT IN AERO
2.0000 | INHALATION_SPRAY | RESPIRATORY_TRACT | 2 refills | Status: AC | PRN
Start: 2023-06-03 — End: ?

## 2023-06-03 NOTE — Patient Instructions (Addendum)
 Environmental allergies 2023 skin testing positive to grass, tree, weed, dust mite, cat, dog, horse. Continue environmental control measures as below. Continue allergy  injections every 4 weeks.  Use over the counter antihistamines such as Zyrtec (cetirizine), Claritin (loratadine), Allegra  (fexofenadine ), or Xyzal (levocetirizine) daily as needed. May take twice a day during allergy  flares. May switch antihistamines every few months. Try to only to take zyrtec 10mg  1 tablet once a day.  Use Flonase  (fluticasone ) nasal spray 1-2 sprays per nostril once a day as needed for nasal congestion.  Nasal saline spray (i.e., Simply Saline) or nasal saline lavage (i.e., NeilMed) is recommended as needed and prior to medicated nasal sprays.  Asthma May use Airsupra rescue inhaler 2 puffs every 4 to 6 hours as needed for shortness of breath, chest tightness, coughing, and wheezing. Do not use more than 12 puffs in 24 hours. May use Airsupra rescue inhaler 2 puffs 5 to 15 minutes prior to strenuous physical activities. Rinse mouth after each use.  Coupon given.  If not covered let us  know. Monitor frequency of use - if you need to use it more than twice per week on a consistent basis let us  know.  Breathing control goals:  Full participation in all desired activities (may need albuterol  before activity) Albuterol  use two times or less a week on average (not counting use with activity) Cough interfering with sleep two times or less a month Oral steroids no more than once a year No hospitalizations   Atopic dermatitis Continue proper skin care.  Drug allergy  Continue to avoid ancef  and sulfa.   Return in about 1 year (around 06/02/2024). Or sooner if needed.   Reducing Pollen Exposure Pollen seasons: trees (spring), grass (summer) and ragweed/weeds (fall). Keep windows closed in your home and car to lower pollen exposure.  Install air conditioning in the bedroom and throughout the house if possible.   Avoid going out in dry windy days - especially early morning. Pollen counts are highest between 5 - 10 AM and on dry, hot and windy days.  Save outside activities for late afternoon or after a heavy rain, when pollen levels are lower.  Avoid mowing of grass if you have grass pollen allergy . Be aware that pollen can also be transported indoors on people and pets.  Dry your clothes in an automatic dryer rather than hanging them outside where they might collect pollen.  Rinse hair and eyes before bedtime.  Pet Allergen Avoidance: Contrary to popular opinion, there are no "hypoallergenic" breeds of dogs or cats. That is because people are not allergic to an animal's hair, but to an allergen found in the animal's saliva, dander (dead skin flakes) or urine. Pet allergy  symptoms typically occur within minutes. For some people, symptoms can build up and become most severe 8 to 12 hours after contact with the animal. People with severe allergies can experience reactions in public places if dander has been transported on the pet owners' clothing. Keeping an animal outdoors is only a partial solution, since homes with pets in the yard still have higher concentrations of animal allergens. Before getting a pet, ask your allergist to determine if you are allergic to animals. If your pet is already considered part of your family, try to minimize contact and keep the pet out of the bedroom and other rooms where you spend a great deal of time. As with dust mites, vacuum carpets often or replace carpet with a hardwood floor, tile or linoleum. High-efficiency particulate air (HEPA)  cleaners can reduce allergen levels over time. While dander and saliva are the source of cat and dog allergens, urine is the source of allergens from rabbits, hamsters, mice and [place] pigs; so ask a non-allergic family member to clean the animal's cage. If you have a pet allergy , talk to your allergist about the potential for allergy   immunotherapy (allergy  shots). This strategy can often provide long-term relief. Control of House Dust Mite Allergen Dust mite allergens are a common trigger of allergy  and asthma symptoms. While they can be found throughout the house, these microscopic creatures thrive in warm, humid environments such as bedding, upholstered furniture and carpeting. Because so much time is spent in the bedroom, it is essential to reduce mite levels there.  Encase pillows, mattresses, and box springs in special allergen-proof fabric covers or airtight, zippered plastic covers.  Bedding should be washed weekly in hot water (130 F) and dried in a hot dryer. Allergen-proof covers are available for comforters and pillows that can't be regularly washed.  Wash the allergy -proof covers every few months. Minimize clutter in the bedroom. Keep pets out of the bedroom.  Keep humidity less than 50% by using a dehumidifier or air conditioning. You can buy a humidity measuring device called a hygrometer to monitor this.  If possible, replace carpets with hardwood, linoleum, or washable area rugs. If that's not possible, vacuum frequently with a vacuum that has a HEPA filter. Remove all upholstered furniture and non-washable window drapes from the bedroom. Remove all non-washable stuffed toys from the bedroom.  Wash stuffed toys weekly.

## 2023-06-05 ENCOUNTER — Ambulatory Visit (INDEPENDENT_AMBULATORY_CARE_PROVIDER_SITE_OTHER)

## 2023-06-05 DIAGNOSIS — J3089 Other allergic rhinitis: Secondary | ICD-10-CM | POA: Diagnosis not present

## 2023-06-05 DIAGNOSIS — J302 Other seasonal allergic rhinitis: Secondary | ICD-10-CM

## 2023-06-12 ENCOUNTER — Ambulatory Visit (INDEPENDENT_AMBULATORY_CARE_PROVIDER_SITE_OTHER)

## 2023-06-12 DIAGNOSIS — J3089 Other allergic rhinitis: Secondary | ICD-10-CM | POA: Diagnosis not present

## 2023-06-12 DIAGNOSIS — J302 Other seasonal allergic rhinitis: Secondary | ICD-10-CM | POA: Diagnosis not present

## 2023-06-19 ENCOUNTER — Ambulatory Visit

## 2023-06-19 DIAGNOSIS — J309 Allergic rhinitis, unspecified: Secondary | ICD-10-CM

## 2023-06-19 DIAGNOSIS — J3089 Other allergic rhinitis: Secondary | ICD-10-CM

## 2023-07-22 ENCOUNTER — Ambulatory Visit (INDEPENDENT_AMBULATORY_CARE_PROVIDER_SITE_OTHER)

## 2023-07-22 DIAGNOSIS — J309 Allergic rhinitis, unspecified: Secondary | ICD-10-CM

## 2023-07-22 DIAGNOSIS — J302 Other seasonal allergic rhinitis: Secondary | ICD-10-CM

## 2023-08-08 ENCOUNTER — Other Ambulatory Visit: Payer: Self-pay | Admitting: General Surgery

## 2023-08-08 DIAGNOSIS — K432 Incisional hernia without obstruction or gangrene: Secondary | ICD-10-CM

## 2023-08-11 ENCOUNTER — Encounter: Payer: Self-pay | Admitting: General Surgery

## 2023-08-20 ENCOUNTER — Ambulatory Visit
Admission: RE | Admit: 2023-08-20 | Discharge: 2023-08-20 | Disposition: A | Discharge status: Home / Self Care | Source: Ambulatory Visit | Attending: General Surgery | Admitting: General Surgery

## 2023-08-20 ENCOUNTER — Encounter: Payer: Self-pay | Admitting: Radiology

## 2023-08-20 DIAGNOSIS — K432 Incisional hernia without obstruction or gangrene: Secondary | ICD-10-CM

## 2023-08-20 MED ORDER — IOPAMIDOL (ISOVUE-300) INJECTION 61%
100.0000 mL | Freq: Once | INTRAVENOUS | Status: AC | PRN
Start: 2023-08-20 — End: 2023-08-20
  Administered 2023-08-20 (×2): 100 mL via INTRAVENOUS

## 2023-08-21 ENCOUNTER — Ambulatory Visit (INDEPENDENT_AMBULATORY_CARE_PROVIDER_SITE_OTHER)

## 2023-08-21 DIAGNOSIS — J309 Allergic rhinitis, unspecified: Secondary | ICD-10-CM

## 2023-09-18 ENCOUNTER — Ambulatory Visit (INDEPENDENT_AMBULATORY_CARE_PROVIDER_SITE_OTHER)

## 2023-09-18 DIAGNOSIS — J309 Allergic rhinitis, unspecified: Secondary | ICD-10-CM | POA: Diagnosis not present

## 2023-10-02 ENCOUNTER — Telehealth: Admitting: Physician Assistant

## 2023-10-02 DIAGNOSIS — N760 Acute vaginitis: Secondary | ICD-10-CM | POA: Diagnosis not present

## 2023-10-02 MED ORDER — DOXYCYCLINE HYCLATE 100 MG PO TABS: 100.0000 mg | ORAL_TABLET | Freq: Two times a day (BID) | ORAL | 0 refills | Status: DC

## 2023-10-02 NOTE — Progress Notes (Signed)
 E-Visit for Cellulitis  We are sorry that you are not feeling well. Here is how we plan to help!  Based on what you shared with me it looks like you have cellulitis of the labia.  Cellulitis looks like areas of skin redness, swelling, and warmth; it develops as a result of bacteria entering under the skin. Little red spots and/or bleeding can be seen in skin, and tiny surface sacs containing fluid can occur. Fever can be present.   I have prescribed:  Doxycycline  100mg  Take 1 twice daily for 7 days  HOME CARE:  Take your medications as ordered and take all of them, even if the skin irritation appears to be healing.   GET HELP RIGHT AWAY IF:  Symptoms that don't begin to go away within 48 hours. Severe redness persists or worsens If the area turns color, spreads or swells. If it blisters and opens, develops yellow-brown crust or bleeds. You develop a fever or chills. If the pain increases or becomes unbearable.  Are unable to keep fluids and food down.  MAKE SURE YOU   Understand these instructions. Will watch your condition. Will get help right away if you are not doing well or get worse.  Thank you for choosing an e-visit.  Your e-visit answers were reviewed by a board certified advanced clinical practitioner to complete your personal care plan. Depending upon the condition, your plan could have included both over the counter or prescription medications.  Please review your pharmacy choice. Make sure the pharmacy is open so you can pick up prescription now. If there is a problem, you may contact your provider through [employer] and have the prescription routed to another pharmacy.  Your safety is important to us . If you have drug allergies check your prescription carefully.   For the next 24 hours you can use MyChart to ask questions about today's visit, request a non-urgent call back, or ask for a work or school excuse. You will get an email in the next two days asking  about your experience. I hope that your e-visit has been valuable and will speed your recovery.   I have spent 5 minutes in review of e-visit questionnaire, review and updating patient chart, medical decision making and response to patient.   Delon CHRISTELLA Dickinson, PA-C

## 2023-10-14 ENCOUNTER — Ambulatory Visit (INDEPENDENT_AMBULATORY_CARE_PROVIDER_SITE_OTHER)

## 2023-10-14 DIAGNOSIS — J309 Allergic rhinitis, unspecified: Secondary | ICD-10-CM | POA: Diagnosis not present

## 2023-10-31 DIAGNOSIS — J3089 Other allergic rhinitis: Secondary | ICD-10-CM | POA: Diagnosis not present

## 2023-10-31 DIAGNOSIS — J3081 Allergic rhinitis due to animal (cat) (dog) hair and dander: Secondary | ICD-10-CM | POA: Diagnosis not present

## 2023-10-31 DIAGNOSIS — J301 Allergic rhinitis due to pollen: Secondary | ICD-10-CM | POA: Diagnosis not present

## 2023-10-31 NOTE — Progress Notes (Signed)
 VIAL MADE ON 10/31/23

## 2023-11-05 ENCOUNTER — Telehealth: Admitting: Physician Assistant

## 2023-11-05 DIAGNOSIS — J45901 Unspecified asthma with (acute) exacerbation: Secondary | ICD-10-CM | POA: Diagnosis not present

## 2023-11-05 DIAGNOSIS — J208 Acute bronchitis due to other specified organisms: Secondary | ICD-10-CM

## 2023-11-05 MED ORDER — PREDNISONE 20 MG PO TABS: 40.0000 mg | ORAL_TABLET | Freq: Every day | ORAL | 0 refills | Status: DC

## 2023-11-05 MED ORDER — BENZONATATE 100 MG PO CAPS: 100.0000 mg | ORAL_CAPSULE | Freq: Three times a day (TID) | ORAL | 0 refills | Status: DC | PRN

## 2023-11-05 NOTE — Progress Notes (Signed)
 We are sorry that you are not feeling well.  Here is how we plan to help!  Based on your presentation I believe you most likely have A cough due to a virus.  This is called viral bronchitis and is best treated by rest, plenty of fluids and control of the cough.  You may use Ibuprofen  or Tylenol  as directed to help your symptoms.     In addition you may use A prescription cough medication called Tessalon  Perles 100mg . You may take 1-2 capsules every 8 hours as needed for your cough. Because of impact on asthma, I am also starting you on a 5-day burst of prednisone .  From your responses in the eVisit questionnaire you describe inflammation in the upper respiratory tract which is causing a significant cough.  This is commonly called Bronchitis and has four common causes:   Allergies Viral Infections Acid Reflux Bacterial Infection Allergies, viruses and acid reflux are treated by controlling symptoms or eliminating the cause. An example might be a cough caused by taking certain blood pressure medications. You stop the cough by changing the medication. Another example might be a cough caused by acid reflux. Controlling the reflux helps control the cough.  USE OF BRONCHODILATOR (RESCUE) INHALERS: There is a risk from using your bronchodilator too frequently.  The risk is that over-reliance on a medication which only relaxes the muscles surrounding the breathing tubes can reduce the effectiveness of medications prescribed to reduce swelling and congestion of the tubes themselves.  Although you feel brief relief from the bronchodilator inhaler, your asthma may actually be worsening with the tubes becoming more swollen and filled with mucus.  This can delay other crucial treatments, such as oral steroid medications. If you need to use a bronchodilator inhaler daily, several times per day, you should discuss this with your provider.  There are probably better treatments that could be used to keep your asthma  under control.     HOME CARE Only take medications as instructed by your medical team. Complete the entire course of an antibiotic. Drink plenty of fluids and get plenty of rest. Avoid close contacts especially the very young and the elderly Cover your mouth if you cough or cough into your sleeve. Always remember to wash your hands A steam or ultrasonic humidifier can help congestion.   GET HELP RIGHT AWAY IF: You develop worsening fever. You become short of breath You cough up blood. Your symptoms persist after you have completed your treatment plan MAKE SURE YOU  Understand these instructions. Will watch your condition. Will get help right away if you are not doing well or get worse.  Your e-visit answers were reviewed by a board certified advanced clinical practitioner to complete your personal care plan.  Depending on the condition, your plan could have included both over the counter or prescription medications. If there is a problem please reply  once you have received a response from your provider. Your safety is important to us .  If you have drug allergies check your prescription carefully.    You can use MyChart to ask questions about today's visit, request a non-urgent call back, or ask for a work or school excuse for 24 hours related to this e-Visit. If it has been greater than 24 hours you will need to follow up with your provider, or enter a new e-Visit to address those concerns. You will get an e-mail in the next two days asking about your experience.  I hope that your  e-visit has been valuable and will speed your recovery. Thank you for using e-visits.   I have spent 5 minutes in review of e-visit questionnaire, review and updating patient chart, medical decision making and response to patient.   Elsie Velma Lunger, PA-C

## 2023-11-11 ENCOUNTER — Ambulatory Visit

## 2023-11-11 DIAGNOSIS — J309 Allergic rhinitis, unspecified: Secondary | ICD-10-CM | POA: Diagnosis not present

## 2023-11-13 IMAGING — DX DG CHEST 2V
2 series · 2 of 2 positions shown · non-contrast
Comparison: 05/25/2021

CLINICAL DATA: Shortness of breath

EXAM:
CHEST - 2 VIEW

[chest pa]
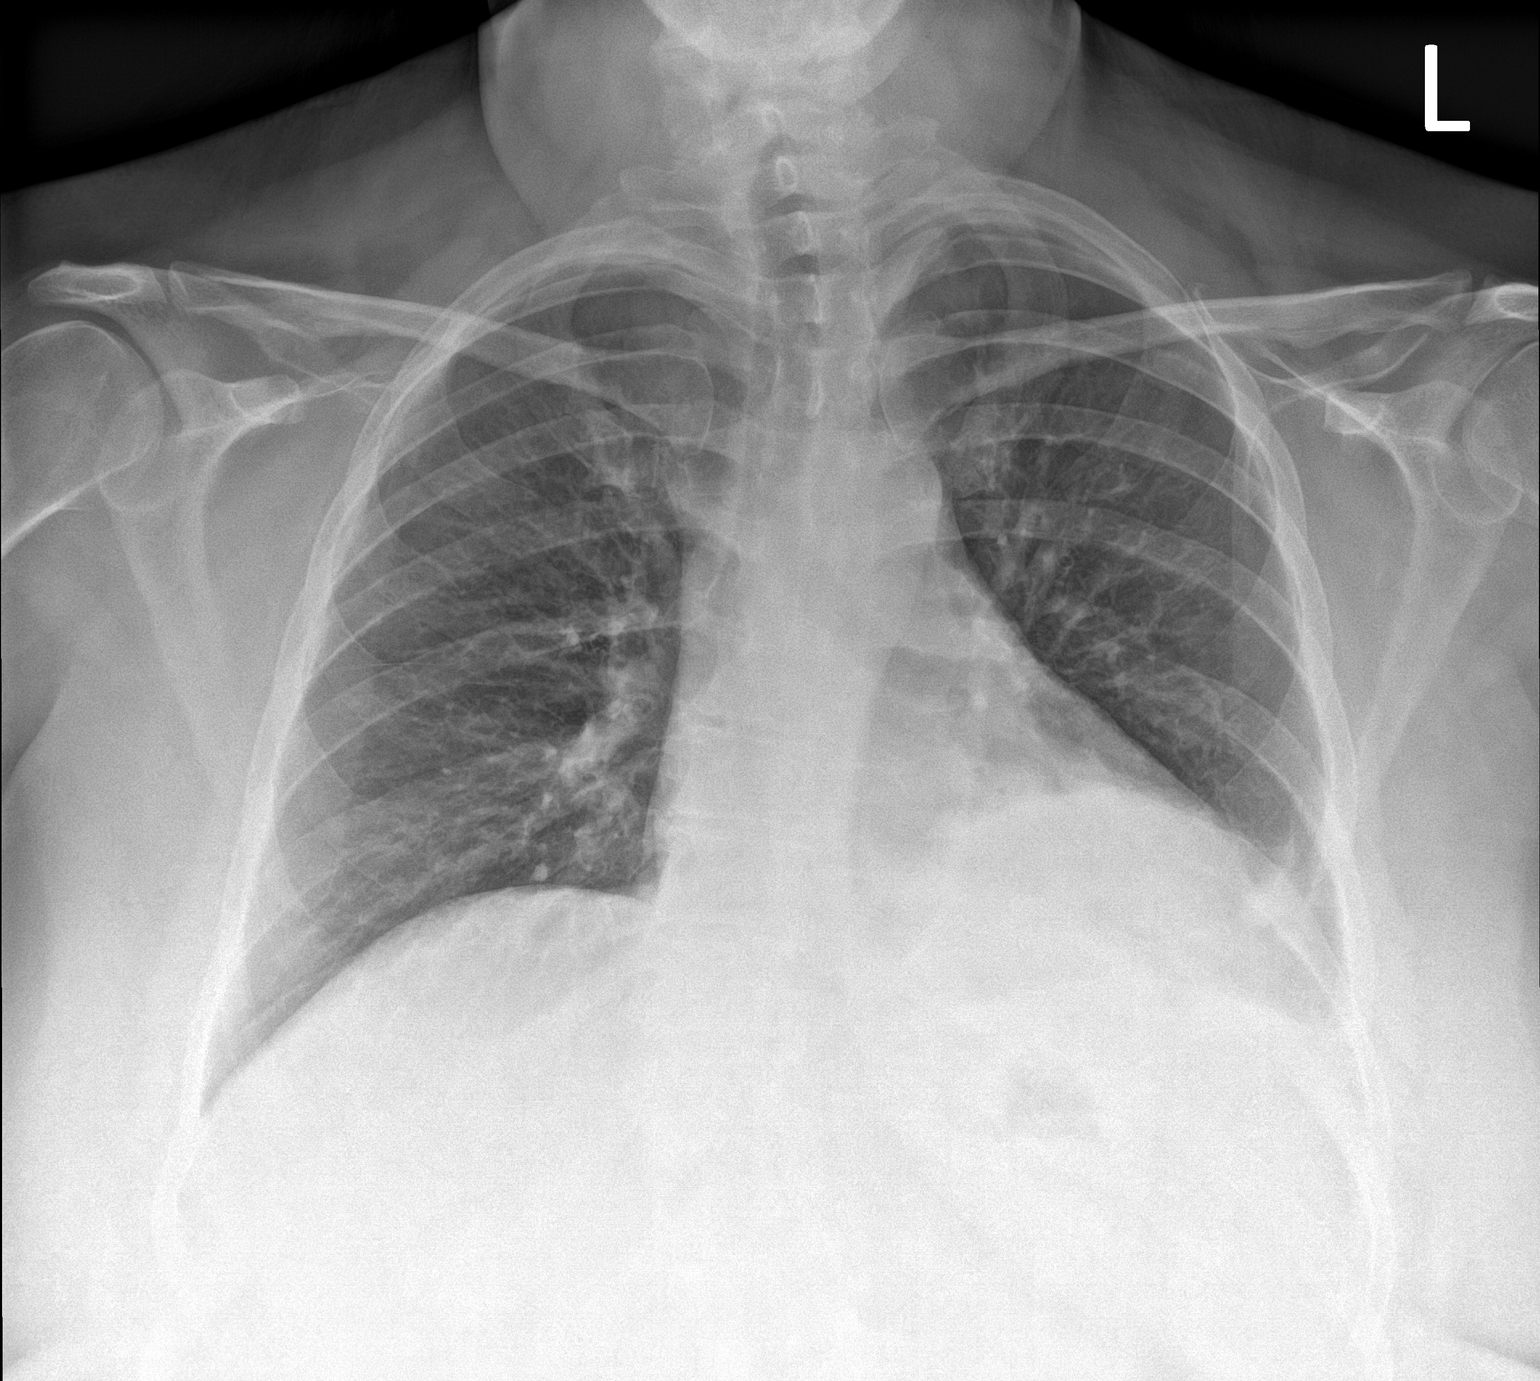

[chest lat]
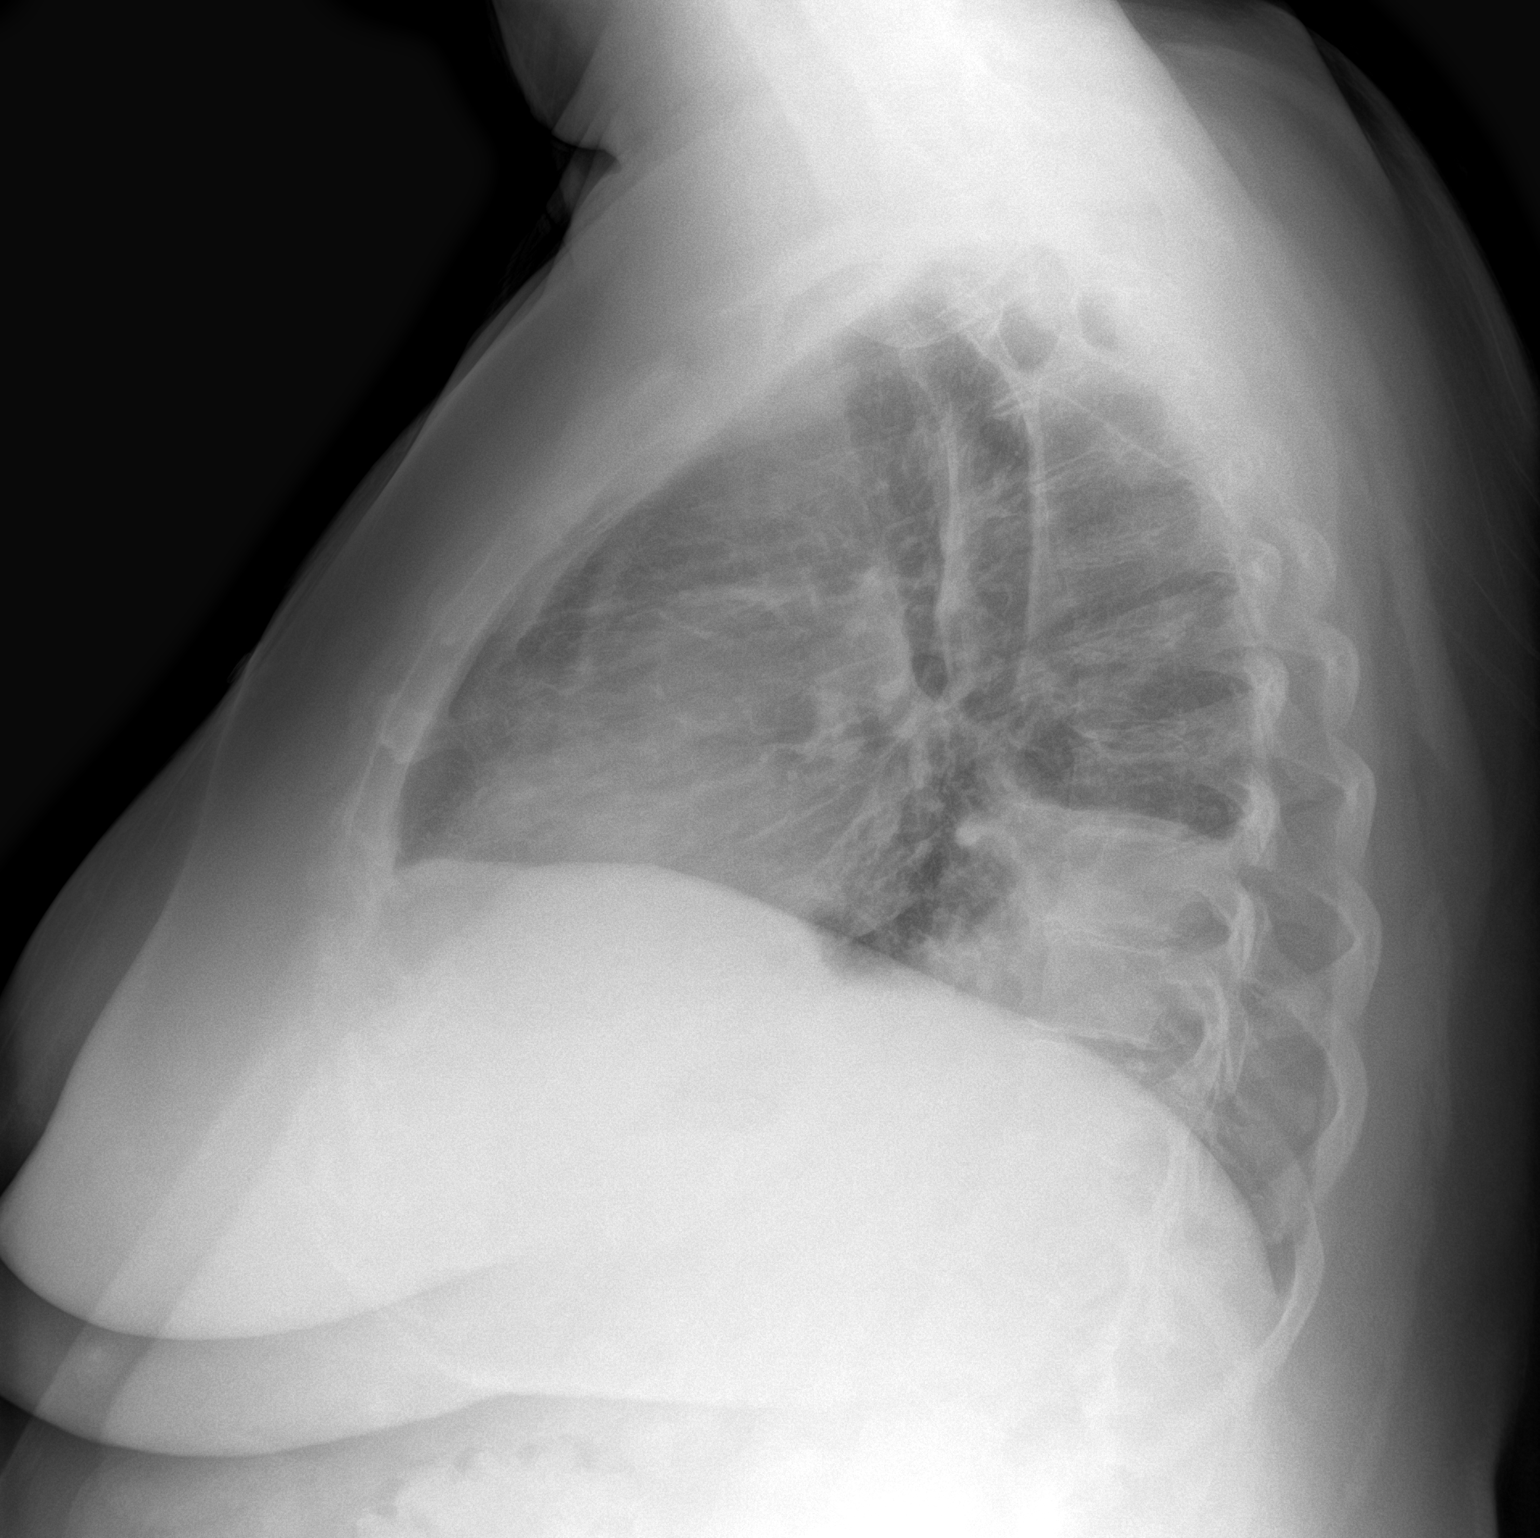

[2 of 2 positions shown; findings below may reference images not displayed]

FINDINGS: Improving left lower lobe opacity, possibly atelectasis/pneumonia,
with associated left pleural effusion.

Right lung is clear.  No pneumothorax.

The heart is normal in size.

Visualized osseous structures are within normal limits.
IMPRESSION: Improving left lower lobe opacity, possibly atelectasis/pneumonia,
with associated left pleural effusion.

Continue follow-up is suggested in 3-4 weeks to document resolution.

## 2023-11-24 NOTE — Progress Notes (Signed)
 Sent message, via epic in basket, requesting orders in epic from Careers adviser.

## 2023-11-27 ENCOUNTER — Ambulatory Visit: Payer: Self-pay | Admitting: General Surgery

## 2023-11-27 NOTE — Progress Notes (Signed)
 Second request for pre op orders left voicemail on nurse triage line.

## 2023-11-28 NOTE — Patient Instructions (Signed)
 SURGICAL WAITING ROOM VISITATION Patients having surgery or a procedure may have no more than 2 support people in the waiting area - these visitors may rotate in the visitor waiting room.   Due to an increase in RSV and influenza rates and associated hospitalizations, children ages 42 and under may not visit patients in [facility] hospitals. If the patient needs to stay at the hospital during part of their recovery, the visitor guidelines for inpatient rooms apply.  PRE-OP VISITATION  Pre-op nurse will coordinate an appropriate time for 1 support person to accompany the patient in pre-op.  This support person may not rotate.  This visitor will be contacted when the time is appropriate for the visitor to come back in the pre-op area.  Please refer to the [facility] website for the visitor guidelines for Inpatients (after your surgery is over and you are in a regular room).  You are not required to quarantine at this time prior to your surgery. However, you must do this: Hand Hygiene often Do NOT share personal items Notify your provider if you are in close contact with someone who has COVID or you develop fever 100.4 or greater, new onset of sneezing, cough, sore throat, shortness of breath or body aches.  If you test positive for Covid or have been in contact with anyone that has tested positive in the last 10 days please notify you surgeon.    Your procedure is scheduled on:  12/15/23  Report to [facility] Main Entrance: [place] entrance where the [employer] is available.   Report to admitting at: 5:15 AM  Call this number if you have any questions or problems the morning of surgery [phone]  FOLLOW ANY ADDITIONAL PRE OP INSTRUCTIONS YOU RECEIVED FROM YOUR SURGEON'S OFFICE!!!  Do not eat food or drink fluids after Midnight the night prior to your surgery/procedure.   Oral Hygiene is also important to reduce your risk of infection.        Remember - BRUSH YOUR TEETH  THE MORNING OF SURGERY WITH YOUR REGULAR TOOTHPASTE  Do NOT smoke after Midnight the night before surgery.  STOP TAKING all Vitamins, Herbs and supplements 1 week before your surgery.   Take ONLY these medicines the morning of surgery with A SIP OF WATER: fluoxetine ,cetirizine.Use inhalers as usal and bring them.                   You may not have any metal on your body including hair pins, jewelry, and body piercing  Do not wear make-up, lotions, powders, perfumes / cologne, or deodorant  Do not wear nail polish including gel and S&S, artificial / acrylic nails, or any other type of covering on natural nails including finger and toenails. If you have artificial nails, gel coating, etc., that needs to be removed by a nail salon, Please have this removed prior to surgery. Not doing so may mean that your surgery could be cancelled or delayed if the Surgeon or anesthesia staff feels like they are unable to monitor you safely.   Do not shave 48 hours prior to surgery to avoid nicks in your skin which may contribute to postoperative infections.   Contacts, Hearing Aids, dentures or bridgework may not be worn into surgery. DENTURES WILL BE REMOVED PRIOR TO SURGERY PLEASE DO NOT APPLY Poly grip OR ADHESIVES!!!  You may bring a small overnight bag with you on the day of surgery, only pack items that are not valuable. [facility] IS NOT RESPONSIBLE   FOR VALUABLES THAT ARE LOST OR STOLEN.   Patients discharged on the day of surgery will not be allowed to drive home.  Someone NEEDS to stay with you for the first 24 hours after anesthesia.  Do not bring your home medications to the hospital. The Pharmacy will dispense medications listed on your medication list to you during your admission in the Hospital.  Special Instructions: Bring a copy of your healthcare power of attorney and living will documents the day of surgery, if you wish to have them scanned into your [facility] Records-  EPIC  Please read over the following fact sheets you were given: IF YOU HAVE QUESTIONS ABOUT YOUR PRE-OP INSTRUCTIONS, PLEASE CALL [phone]   [facility] - Preparing for Surgery      Before surgery, you can play an important role.  Because skin is not sterile, your skin needs to be as free of germs as possible.  You can reduce the number of germs on your skin by washing with CHG (chlorahexidine gluconate) soap before surgery.  CHG is an antiseptic cleaner which kills germs and bonds with the skin to continue killing germs even after washing. Please DO NOT use if you have an allergy  to CHG or antibacterial soaps.  If your skin becomes reddened/irritated stop using the CHG and inform your nurse when you arrive at Short Stay. Do not shave (including legs and underarms) for at least 48 hours prior to the first CHG shower.  You may shave your face/neck.  Please follow these instructions carefully:  1.  Shower with CHG Soap the night before surgery ONLY (DO NOT USE THE SOAP THE MORNING OF SURGERY).  2.  If you choose to wash your hair, wash your hair first as usual with your normal  shampoo.  3.  After you shampoo, rinse your hair and body thoroughly to remove the shampoo.                             4.  Use CHG as you would any other liquid soap.  You can apply chg directly to the skin and wash.  Gently with a scrungie or clean washcloth.  5.  Apply the CHG Soap to your body ONLY FROM THE NECK DOWN.   Do not use on face/ open                           Wound or open sores. Avoid contact with eyes, ears mouth and genitals (private parts).                       Wash face,  Genitals (private parts) with your normal soap.             6.  Wash thoroughly, paying special attention to the area where your  surgery  will be performed.  7.  Thoroughly rinse your body with warm water from the neck down.  8.  DO NOT shower/wash with your normal soap after using and rinsing off the CHG Soap.                9.   Pat yourself dry with a clean towel.            10.  Wear clean pajamas.            11.  Place clean sheets  on your bed the night of your first shower and do not  sleep with pets.  Day of Surgery : Do not apply any CHG, lotions/deodorants the morning of surgery.  Please wear clean clothes to the hospital/surgery center.   FAILURE TO FOLLOW THESE INSTRUCTIONS MAY RESULT IN THE CANCELLATION OF YOUR SURGERY  PATIENT SIGNATURE_________________________________  NURSE SIGNATURE__________________________________  ________________________________________________________________________

## 2023-12-01 ENCOUNTER — Encounter (HOSPITAL_COMMUNITY): Payer: Self-pay | Admitting: *Deleted

## 2023-12-01 ENCOUNTER — Encounter (HOSPITAL_COMMUNITY)
Admission: RE | Admit: 2023-12-01 | Discharge: 2023-12-01 | Disposition: A | Discharge status: Home / Self Care | Source: Ambulatory Visit | Attending: General Surgery | Admitting: General Surgery

## 2023-12-01 ENCOUNTER — Other Ambulatory Visit: Payer: Self-pay

## 2023-12-01 VITALS — BP 145/87 | HR 95 | Temp 97.7°F | Ht 67.0 in | Wt 270.0 lb

## 2023-12-01 DIAGNOSIS — Z01818 Encounter for other preprocedural examination: Secondary | ICD-10-CM

## 2023-12-01 DIAGNOSIS — Z01812 Encounter for preprocedural laboratory examination: Secondary | ICD-10-CM | POA: Insufficient documentation

## 2023-12-01 HISTORY — DX: Attention-deficit hyperactivity disorder, unspecified type: F90.9

## 2023-12-01 HISTORY — DX: Pneumonia, unspecified organism: J18.9

## 2023-12-01 LAB — CBC
HCT: 43.2 % (ref 36.0–46.0)
Hemoglobin: 14.1 g/dL (ref 12.0–15.0)
MCH: 29.1 pg (ref 26.0–34.0)
MCHC: 32.6 g/dL (ref 30.0–36.0)
MCV: 89.1 fL (ref 80.0–100.0)
Platelets: 232 K/uL (ref 150–400)
RBC: 4.85 MIL/uL (ref 3.87–5.11)
RDW: 13.2 % (ref 11.5–15.5)
WBC: 7.4 K/uL (ref 4.0–10.5)
nRBC: 0 % (ref 0.0–0.2)

## 2023-12-01 NOTE — Progress Notes (Signed)
 For Anesthesia: PCP - Early, Camie BRAVO, NP  Cardiologist - N/A  Bowel Prep reminder:  Chest x-ray -  EKG -  Stress Test -  ECHO -  Cardiac Cath -  Pacemaker/ICD device last checked: Pacemaker orders received: Device Rep notified:  Spinal Cord Stimulator:N/A  Sleep Study - N/A CPAP -   Fasting Blood Sugar - N/A Checks Blood Sugar _____ times a day Date and result of last Hgb A1c-  Last dose of GLP1 agonist- N/A GLP1 instructions: Hold 7 days prior to schedule (Hold 24 hours-daily)   Last dose of SGLT-2 inhibitors- N/A SGLT-2 instructions: Hold 72 hours prior to surgery  Blood Thinner Instructions:N/A Last Dose: Time last taken:  Aspirin Instructions:N/A Last Dose: Time last taken:  Activity level: Can go up a flight of stairs and activities of daily living without stopping and without chest pain and/or shortness of breath   Able to exercise without chest pain and/or shortness of breath  Anesthesia review:   Patient denies shortness of breath, fever, cough and chest pain at PAT appointment   Patient verbalized understanding of instructions that were reviewed over the telephone.

## 2023-12-07 IMAGING — DX DG CHEST 2V
2 series · 2 of 2 positions shown · non-contrast
Comparison: 06/04/2021

CLINICAL DATA: Pneumonia follow-up.

EXAM:
CHEST - 2 VIEW

[chest pa]
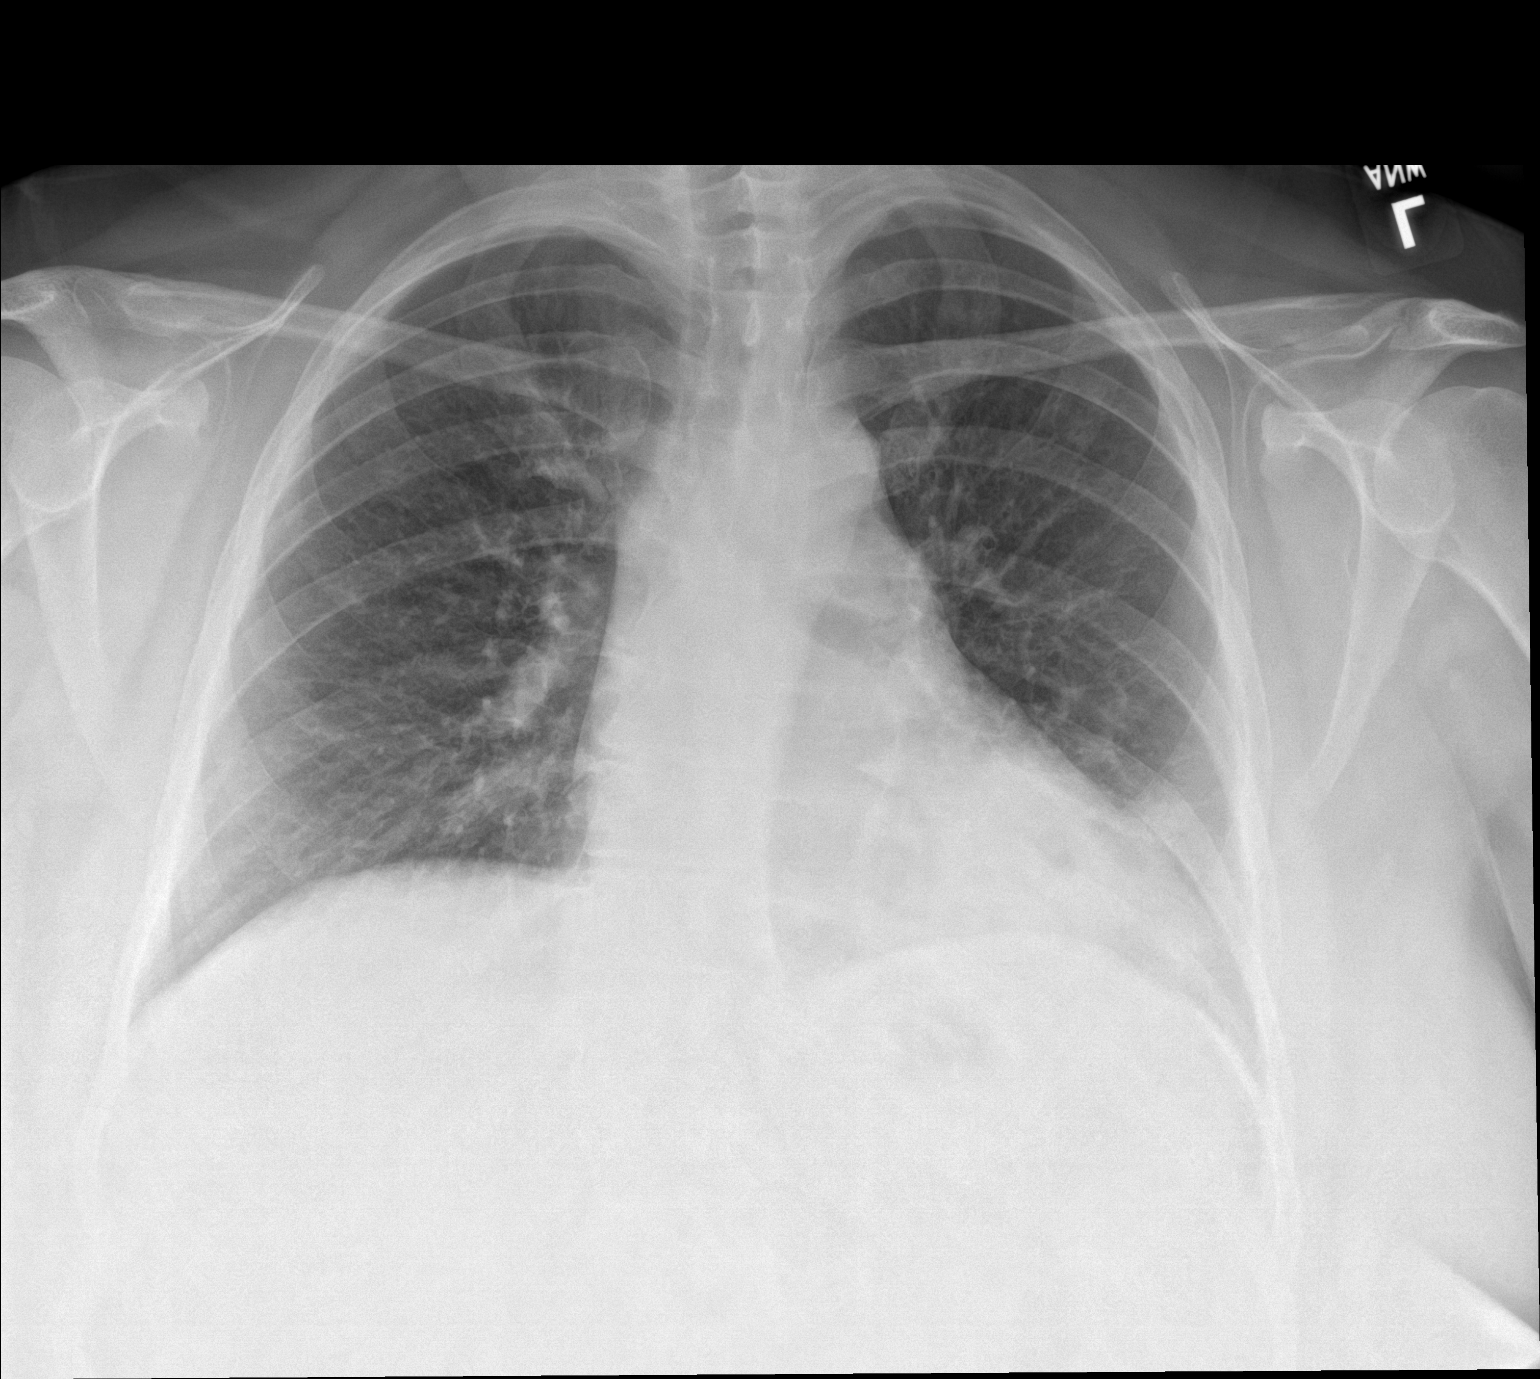

[chest lat]
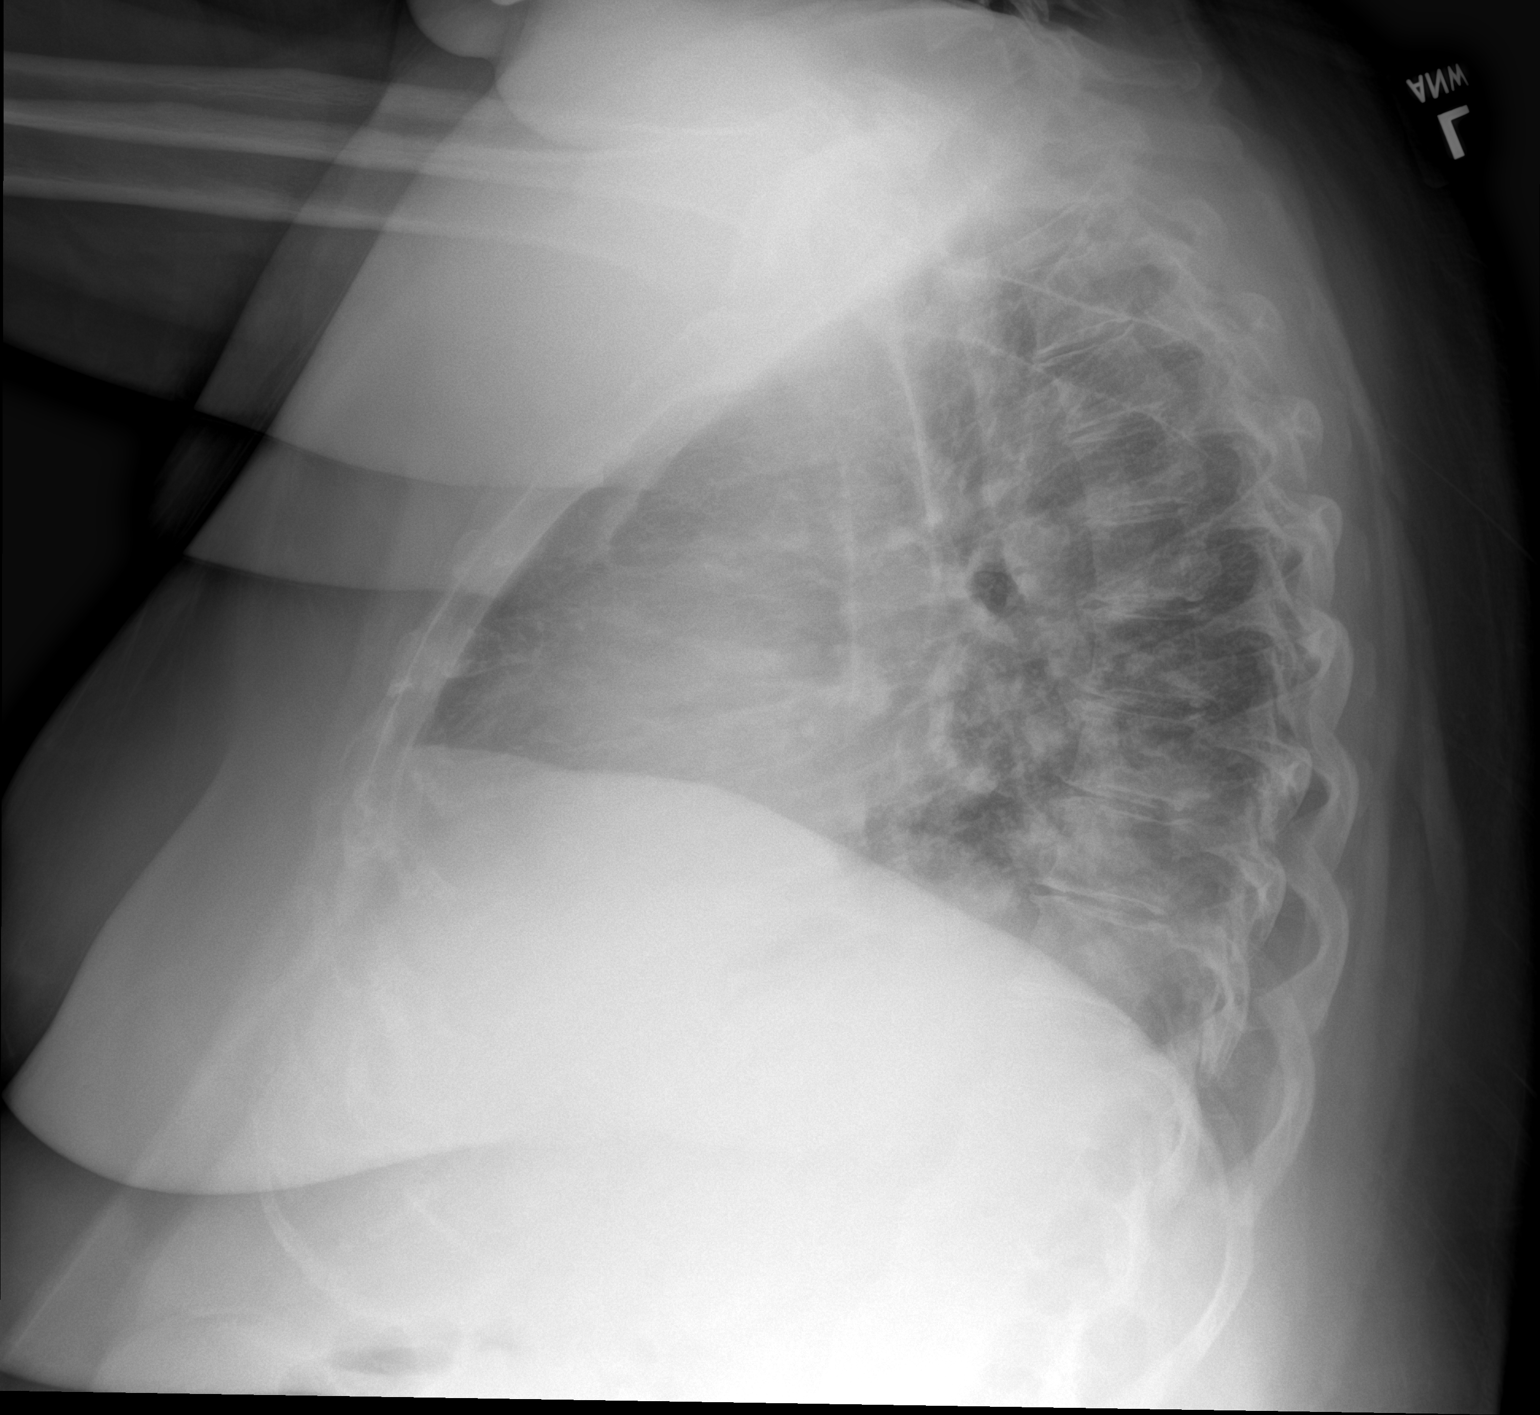

[2 of 2 positions shown; findings below may reference images not displayed]

FINDINGS: Improved but persistent retrocardiac left base opacity without
substantial pleural effusion. Right lung clear. The
cardiopericardial silhouette is within normal limits for size. The
visualized bony structures of the thorax are unremarkable.
IMPRESSION: Improved but persistent fairly prominent retrocardiac left base
opacity. Given the long persistence, CT imaging may be warranted to
further evaluate.

## 2023-12-09 ENCOUNTER — Ambulatory Visit (INDEPENDENT_AMBULATORY_CARE_PROVIDER_SITE_OTHER)

## 2023-12-09 DIAGNOSIS — J309 Allergic rhinitis, unspecified: Secondary | ICD-10-CM

## 2023-12-09 DIAGNOSIS — J3089 Other allergic rhinitis: Secondary | ICD-10-CM

## 2023-12-14 NOTE — Anesthesia Preprocedure Evaluation (Signed)
 Anesthesia Evaluation  Patient identified by MRN, date of birth, ID band Patient awake    Reviewed: Allergy  & Precautions, H&P , NPO status , Patient's Chart, lab work & pertinent test results  Airway Mallampati: II  TM Distance: >3 FB Neck ROM: Full    Dental no notable dental hx. (+) Teeth Intact, Dental Advisory Given   Pulmonary neg pulmonary ROS, asthma , sleep apnea , pneumonia   Pulmonary exam normal breath sounds clear to auscultation       Cardiovascular Exercise Tolerance: Good hypertension, Pt. on medications negative cardio ROS Normal cardiovascular exam Rhythm:Regular Rate:Normal     Neuro/Psych  PSYCHIATRIC DISORDERS Anxiety Depression    negative neurological ROS  negative psych ROS   GI/Hepatic negative GI ROS, Neg liver ROS,,,  Endo/Other    Class 3 obesity  Renal/GU negative Renal ROS  negative genitourinary   Musculoskeletal negative musculoskeletal ROS (+)    Abdominal   Peds negative pediatric ROS (+)  Hematology negative hematology ROS (+)   Anesthesia Other Findings   Reproductive/Obstetrics negative OB ROS                              Anesthesia Physical Anesthesia Plan  ASA: 3  Anesthesia Plan: General   Post-op Pain Management: Minimal or no pain anticipated, Tylenol  PO (pre-op)* and Celebrex  PO (pre-op)*   Induction: Intravenous  PONV Risk Score and Plan: 3 and Ondansetron  and Dexamethasone   Airway Management Planned: Oral ETT  Additional Equipment: None  Intra-op Plan:   Post-operative Plan: Extubation in OR  Informed Consent: I have reviewed the patients History and Physical, chart, labs and discussed the procedure including the risks, benefits and alternatives for the proposed anesthesia with the patient or authorized representative who has indicated his/her understanding and acceptance.       Plan Discussed with: Anesthesiologist and  CRNA  Anesthesia Plan Comments: (  )         Anesthesia Quick Evaluation

## 2023-12-15 ENCOUNTER — Encounter (HOSPITAL_COMMUNITY): Admission: RE | Disposition: A | Payer: Self-pay | Source: Home / Self Care | Attending: General Surgery

## 2023-12-15 ENCOUNTER — Other Ambulatory Visit: Payer: Self-pay

## 2023-12-15 ENCOUNTER — Encounter (HOSPITAL_COMMUNITY): Payer: Self-pay | Admitting: General Surgery

## 2023-12-15 ENCOUNTER — Ambulatory Visit (HOSPITAL_COMMUNITY): Payer: Self-pay | Admitting: Physician Assistant

## 2023-12-15 ENCOUNTER — Inpatient Hospital Stay (HOSPITAL_COMMUNITY)
Admission: RE | Admit: 2023-12-15 | Discharge: 2023-12-17 | DRG: 354 | Disposition: A | Attending: General Surgery | Admitting: General Surgery

## 2023-12-15 ENCOUNTER — Inpatient Hospital Stay (HOSPITAL_COMMUNITY): Payer: Self-pay

## 2023-12-15 DIAGNOSIS — Z98891 History of uterine scar from previous surgery: Secondary | ICD-10-CM | POA: Diagnosis not present

## 2023-12-15 DIAGNOSIS — Z8249 Family history of ischemic heart disease and other diseases of the circulatory system: Secondary | ICD-10-CM | POA: Diagnosis not present

## 2023-12-15 DIAGNOSIS — G4730 Sleep apnea, unspecified: Secondary | ICD-10-CM | POA: Diagnosis not present

## 2023-12-15 DIAGNOSIS — Z803 Family history of malignant neoplasm of breast: Secondary | ICD-10-CM | POA: Diagnosis not present

## 2023-12-15 DIAGNOSIS — F909 Attention-deficit hyperactivity disorder, unspecified type: Secondary | ICD-10-CM | POA: Diagnosis present

## 2023-12-15 DIAGNOSIS — F32A Depression, unspecified: Secondary | ICD-10-CM | POA: Diagnosis present

## 2023-12-15 DIAGNOSIS — Z8052 Family history of malignant neoplasm of bladder: Secondary | ICD-10-CM | POA: Diagnosis not present

## 2023-12-15 DIAGNOSIS — Z8632 Personal history of gestational diabetes: Secondary | ICD-10-CM | POA: Diagnosis not present

## 2023-12-15 DIAGNOSIS — Z5331 Laparoscopic surgical procedure converted to open procedure: Secondary | ICD-10-CM | POA: Diagnosis not present

## 2023-12-15 DIAGNOSIS — F418 Other specified anxiety disorders: Secondary | ICD-10-CM | POA: Diagnosis not present

## 2023-12-15 DIAGNOSIS — Z6841 Body Mass Index (BMI) 40.0 and over, adult: Secondary | ICD-10-CM | POA: Diagnosis not present

## 2023-12-15 DIAGNOSIS — K432 Incisional hernia without obstruction or gangrene: Secondary | ICD-10-CM | POA: Diagnosis present

## 2023-12-15 DIAGNOSIS — K430 Incisional hernia with obstruction, without gangrene: Secondary | ICD-10-CM | POA: Diagnosis not present

## 2023-12-15 DIAGNOSIS — J45909 Unspecified asthma, uncomplicated: Secondary | ICD-10-CM | POA: Diagnosis present

## 2023-12-15 DIAGNOSIS — Z888 Allergy status to other drugs, medicaments and biological substances status: Secondary | ICD-10-CM | POA: Diagnosis not present

## 2023-12-15 DIAGNOSIS — Z79899 Other long term (current) drug therapy: Secondary | ICD-10-CM | POA: Diagnosis not present

## 2023-12-15 DIAGNOSIS — Z808 Family history of malignant neoplasm of other organs or systems: Secondary | ICD-10-CM | POA: Diagnosis not present

## 2023-12-15 DIAGNOSIS — I10 Essential (primary) hypertension: Secondary | ICD-10-CM | POA: Diagnosis not present

## 2023-12-15 DIAGNOSIS — Z833 Family history of diabetes mellitus: Secondary | ICD-10-CM | POA: Diagnosis not present

## 2023-12-15 DIAGNOSIS — Z882 Allergy status to sulfonamides status: Secondary | ICD-10-CM | POA: Diagnosis not present

## 2023-12-15 DIAGNOSIS — F419 Anxiety disorder, unspecified: Secondary | ICD-10-CM | POA: Diagnosis present

## 2023-12-15 DIAGNOSIS — E66813 Obesity, class 3: Secondary | ICD-10-CM | POA: Diagnosis present

## 2023-12-15 DIAGNOSIS — Z01818 Encounter for other preprocedural examination: Secondary | ICD-10-CM

## 2023-12-15 HISTORY — PX: XI ROBOTIC ASSISTED VENTRAL HERNIA: SHX6789

## 2023-12-15 HISTORY — PX: LAPAROSCOPY: SHX197

## 2023-12-15 LAB — CBC
HCT: 39.7 % (ref 36.0–46.0)
HCT: 40.7 % (ref 36.0–46.0)
Hemoglobin: 13.1 g/dL (ref 12.0–15.0)
Hemoglobin: 13.4 g/dL (ref 12.0–15.0)
MCH: 29.3 pg (ref 26.0–34.0)
MCH: 29.8 pg (ref 26.0–34.0)
MCHC: 33 g/dL (ref 30.0–36.0)
MCHC: 32.9 g/dL (ref 30.0–36.0)
MCV: 88.8 fL (ref 80.0–100.0)
MCV: 90.4 fL (ref 80.0–100.0)
Platelets: 210 K/uL (ref 150–400)
Platelets: 270 K/uL (ref 150–400)
RBC: 4.47 MIL/uL (ref 3.87–5.11)
RBC: 4.5 MIL/uL (ref 3.87–5.11)
RDW: 13.1 % (ref 11.5–15.5)
RDW: 13.1 % (ref 11.5–15.5)
WBC: 15.4 K/uL — ABNORMAL HIGH (ref 4.0–10.5)
WBC: 12.7 K/uL — ABNORMAL HIGH (ref 4.0–10.5)
nRBC: 0 % (ref 0.0–0.2)
nRBC: 0 % (ref 0.0–0.2)

## 2023-12-15 LAB — POCT PREGNANCY, URINE: Preg Test, Ur: NEGATIVE

## 2023-12-15 LAB — CREATININE, SERUM
Creatinine, Ser: 0.71 mg/dL (ref 0.44–1.00)
GFR, Estimated: >60 mL/min (ref >60)

## 2023-12-15 SURGERY — REPAIR, HERNIA, VENTRAL, ROBOT-ASSISTED
Anesthesia: General

## 2023-12-15 MED ORDER — SUGAMMADEX SODIUM 200 MG/2ML IV SOLN
INTRAVENOUS | Status: DC | PRN
  Administered 2023-12-15: 300 mg via INTRAVENOUS

## 2023-12-15 MED ORDER — SUCCINYLCHOLINE CHLORIDE 200 MG/10ML IV SOSY
PREFILLED_SYRINGE | INTRAVENOUS | Status: DC | PRN
  Administered 2023-12-15: 200 mg via INTRAVENOUS

## 2023-12-15 MED ORDER — SODIUM CHLORIDE 0.9 % IV SOLN: INTRAVENOUS | Status: DC | PRN

## 2023-12-15 MED ORDER — BUPIVACAINE-EPINEPHRINE 0.25% -1:200000 IJ SOLN
INTRAMUSCULAR | Status: DC | PRN
  Administered 2023-12-15: 30 mL

## 2023-12-15 MED ORDER — DEXMEDETOMIDINE HCL IN NACL 80 MCG/20ML IV SOLN
INTRAVENOUS | Status: AC
  Filled 2023-12-15: qty 20

## 2023-12-15 MED ORDER — HYDROMORPHONE HCL 2 MG/ML IJ SOLN
INTRAMUSCULAR | Status: AC
  Filled 2023-12-15: qty 1

## 2023-12-15 MED ORDER — ENOXAPARIN SODIUM 40 MG/0.4ML IJ SOSY
40.0000 mg | PREFILLED_SYRINGE | INTRAMUSCULAR | Status: DC
  Administered 2023-12-16: 40 mg via SUBCUTANEOUS
  Filled 2023-12-15: qty 0.4

## 2023-12-15 MED ORDER — ORAL CARE MOUTH RINSE: 15.0000 mL | Freq: Once | OROMUCOSAL | Status: AC

## 2023-12-15 MED ORDER — 0.9 % SODIUM CHLORIDE (POUR BTL) OPTIME
TOPICAL | Status: DC | PRN
  Administered 2023-12-15: 2000 mL
  Administered 2023-12-15 (×2): 1000 mL

## 2023-12-15 MED ORDER — ALBUMIN HUMAN 5 % IV SOLN: INTRAVENOUS | Status: DC | PRN

## 2023-12-15 MED ORDER — KETAMINE HCL 50 MG/5ML IJ SOSY
PREFILLED_SYRINGE | INTRAMUSCULAR | Status: AC
  Filled 2023-12-15: qty 5

## 2023-12-15 MED ORDER — ACETAMINOPHEN 500 MG PO TABS
1000.0000 mg | ORAL_TABLET | ORAL | Status: AC
  Administered 2023-12-15: 1000 mg via ORAL
  Filled 2023-12-15: qty 2

## 2023-12-15 MED ORDER — CEFAZOLIN SODIUM-DEXTROSE 2-4 GM/100ML-% IV SOLN: 2.0000 g | INTRAVENOUS | Status: DC

## 2023-12-15 MED ORDER — LACTATED RINGERS IV SOLN: INTRAVENOUS | Status: DC

## 2023-12-15 MED ORDER — PROPOFOL 10 MG/ML IV BOLUS
INTRAVENOUS | Status: DC | PRN
  Administered 2023-12-15: 200 mg via INTRAVENOUS

## 2023-12-15 MED ORDER — CLINDAMYCIN PHOSPHATE 900 MG/50ML IV SOLN
INTRAVENOUS | Status: AC
  Filled 2023-12-15: qty 50

## 2023-12-15 MED ORDER — METHOCARBAMOL 1000 MG/10ML IJ SOLN
500.0000 mg | Freq: Three times a day (TID) | INTRAMUSCULAR | Status: DC | PRN
  Administered 2023-12-15: 500 mg via INTRAVENOUS
  Filled 2023-12-15: qty 10

## 2023-12-15 MED ORDER — FENTANYL CITRATE (PF) 50 MCG/ML IJ SOSY: 25.0000 ug | PREFILLED_SYRINGE | INTRAMUSCULAR | Status: DC | PRN

## 2023-12-15 MED ORDER — MEPERIDINE HCL 25 MG/ML IJ SOLN: 6.2500 mg | INTRAMUSCULAR | Status: DC | PRN

## 2023-12-15 MED ORDER — ACETAMINOPHEN 500 MG PO TABS: 1000.0000 mg | ORAL_TABLET | Freq: Once | ORAL | Status: DC

## 2023-12-15 MED ORDER — ONDANSETRON HCL 4 MG/2ML IJ SOLN
INTRAMUSCULAR | Status: AC
  Filled 2023-12-15: qty 2

## 2023-12-15 MED ORDER — VASOPRESSIN 20 UNIT/ML IV SOLN
INTRAVENOUS | Status: DC | PRN
  Administered 2023-12-15: 1 [IU] via INTRAVENOUS
  Administered 2023-12-15 (×2): .5 [IU] via INTRAVENOUS

## 2023-12-15 MED ORDER — FENTANYL CITRATE (PF) 100 MCG/2ML IJ SOLN
INTRAMUSCULAR | Status: DC | PRN
  Administered 2023-12-15 (×4): 25 ug via INTRAVENOUS
  Administered 2023-12-15: 50 ug via INTRAVENOUS
  Administered 2023-12-15 (×2): 25 ug via INTRAVENOUS

## 2023-12-15 MED ORDER — FLUOXETINE HCL 20 MG PO CAPS
40.0000 mg | ORAL_CAPSULE | Freq: Every day | ORAL | Status: DC
  Administered 2023-12-15 – 2023-12-16 (×2): 40 mg via ORAL
  Filled 2023-12-15 (×2): qty 2

## 2023-12-15 MED ORDER — ACETAMINOPHEN 500 MG PO TABS
1000.0000 mg | ORAL_TABLET | Freq: Four times a day (QID) | ORAL | Status: DC
  Administered 2023-12-15 – 2023-12-17 (×7): 1000 mg via ORAL
  Filled 2023-12-15 (×7): qty 2

## 2023-12-15 MED ORDER — CELECOXIB 200 MG PO CAPS
200.0000 mg | ORAL_CAPSULE | ORAL | Status: AC
  Administered 2023-12-15: 200 mg via ORAL
  Filled 2023-12-15: qty 1

## 2023-12-15 MED ORDER — SUCCINYLCHOLINE CHLORIDE 200 MG/10ML IV SOSY
PREFILLED_SYRINGE | INTRAVENOUS | Status: AC
  Filled 2023-12-15: qty 10

## 2023-12-15 MED ORDER — EPHEDRINE 5 MG/ML INJ
INTRAVENOUS | Status: AC
  Filled 2023-12-15: qty 5

## 2023-12-15 MED ORDER — FIBRIN SEALANT 2 ML SINGLE DOSE KIT
2.0000 mL | PACK | Freq: Once | CUTANEOUS | Status: AC
  Administered 2023-12-15: 2 mL via TOPICAL
  Filled 2023-12-15: qty 2

## 2023-12-15 MED ORDER — CHLORHEXIDINE GLUCONATE CLOTH 2 % EX PADS: 6.0000 | MEDICATED_PAD | Freq: Once | CUTANEOUS | Status: DC

## 2023-12-15 MED ORDER — STERILE WATER FOR IRRIGATION IR SOLN
Status: DC | PRN
  Administered 2023-12-15: 1000 mL

## 2023-12-15 MED ORDER — ONDANSETRON HCL 4 MG/2ML IJ SOLN: 4.0000 mg | Freq: Four times a day (QID) | INTRAMUSCULAR | Status: DC | PRN

## 2023-12-15 MED ORDER — LIDOCAINE HCL (CARDIAC) PF 100 MG/5ML IV SOSY
PREFILLED_SYRINGE | INTRAVENOUS | Status: DC | PRN
  Administered 2023-12-15: 100 mg via INTRAVENOUS

## 2023-12-15 MED ORDER — HYDROMORPHONE HCL 1 MG/ML IJ SOLN
INTRAMUSCULAR | Status: DC | PRN
  Administered 2023-12-15 (×2): .5 mg via INTRAVENOUS

## 2023-12-15 MED ORDER — HYDROMORPHONE HCL 1 MG/ML IJ SOLN
1.0000 mg | INTRAMUSCULAR | Status: DC | PRN
  Administered 2023-12-15 – 2023-12-16 (×4): 1 mg via INTRAVENOUS
  Filled 2023-12-15 (×4): qty 1

## 2023-12-15 MED ORDER — MIDAZOLAM HCL 5 MG/5ML IJ SOLN
INTRAMUSCULAR | Status: DC | PRN
  Administered 2023-12-15: 2 mg via INTRAVENOUS

## 2023-12-15 MED ORDER — DEXAMETHASONE SOD PHOSPHATE PF 10 MG/ML IJ SOLN
INTRAMUSCULAR | Status: DC | PRN
  Administered 2023-12-15: 8 mg via INTRAVENOUS

## 2023-12-15 MED ORDER — OXYCODONE HCL 5 MG/5ML PO SOLN: 5.0000 mg | Freq: Once | ORAL | Status: DC | PRN

## 2023-12-15 MED ORDER — ALBUTEROL SULFATE (2.5 MG/3ML) 0.083% IN NEBU: 2.5000 mg | INHALATION_SOLUTION | RESPIRATORY_TRACT | Status: DC | PRN

## 2023-12-15 MED ORDER — ROCURONIUM BROMIDE 10 MG/ML (PF) SYRINGE
PREFILLED_SYRINGE | INTRAVENOUS | Status: AC
  Filled 2023-12-15: qty 10

## 2023-12-15 MED ORDER — ONDANSETRON HCL 4 MG/2ML IJ SOLN
INTRAMUSCULAR | Status: DC | PRN
  Administered 2023-12-15: 4 mg via INTRAVENOUS

## 2023-12-15 MED ORDER — ALBUMIN HUMAN 5 % IV SOLN
INTRAVENOUS | Status: AC
  Filled 2023-12-15: qty 250

## 2023-12-15 MED ORDER — PHENYLEPHRINE 80 MCG/ML (10ML) SYRINGE FOR IV PUSH (FOR BLOOD PRESSURE SUPPORT)
PREFILLED_SYRINGE | INTRAVENOUS | Status: DC | PRN
  Administered 2023-12-15 (×4): 80 ug via INTRAVENOUS
  Administered 2023-12-15: 160 ug via INTRAVENOUS

## 2023-12-15 MED ORDER — PHENYLEPHRINE 80 MCG/ML (10ML) SYRINGE FOR IV PUSH (FOR BLOOD PRESSURE SUPPORT)
PREFILLED_SYRINGE | INTRAVENOUS | Status: AC
  Filled 2023-12-15: qty 10

## 2023-12-15 MED ORDER — ALBUTEROL-BUDESONIDE 90-80 MCG/ACT IN AERO: 2.0000 | INHALATION_SPRAY | RESPIRATORY_TRACT | Status: DC | PRN

## 2023-12-15 MED ORDER — PHENYLEPHRINE HCL-NACL 20-0.9 MG/250ML-% IV SOLN
INTRAVENOUS | Status: DC | PRN
  Administered 2023-12-15: 50 ug/min via INTRAVENOUS

## 2023-12-15 MED ORDER — MIDAZOLAM HCL 2 MG/2ML IJ SOLN
INTRAMUSCULAR | Status: AC
  Filled 2023-12-15: qty 2

## 2023-12-15 MED ORDER — KETAMINE HCL 50 MG/5ML IJ SOSY
PREFILLED_SYRINGE | INTRAMUSCULAR | Status: DC | PRN
  Administered 2023-12-15: 10 mg via INTRAVENOUS
  Administered 2023-12-15: 20 mg via INTRAVENOUS
  Administered 2023-12-15: 10 mg via INTRAVENOUS

## 2023-12-15 MED ORDER — CLINDAMYCIN PHOSPHATE 900 MG/50ML IV SOLN
INTRAVENOUS | Status: DC | PRN
  Administered 2023-12-15: 900 mg via INTRAVENOUS

## 2023-12-15 MED ORDER — VASOPRESSIN 20 UNIT/ML IV SOLN
INTRAVENOUS | Status: AC
  Filled 2023-12-15: qty 1

## 2023-12-15 MED ORDER — METHOCARBAMOL 500 MG PO TABS: 500.0000 mg | ORAL_TABLET | Freq: Three times a day (TID) | ORAL | Status: DC | PRN

## 2023-12-15 MED ORDER — CHLORHEXIDINE GLUCONATE 0.12 % MT SOLN
15.0000 mL | Freq: Once | OROMUCOSAL | Status: AC
  Administered 2023-12-15: 15 mL via OROMUCOSAL

## 2023-12-15 MED ORDER — FENTANYL CITRATE (PF) 100 MCG/2ML IJ SOLN
INTRAMUSCULAR | Status: AC
  Filled 2023-12-15: qty 2

## 2023-12-15 MED ORDER — OXYCODONE HCL 5 MG PO TABS: 5.0000 mg | ORAL_TABLET | Freq: Once | ORAL | Status: DC | PRN

## 2023-12-15 MED ORDER — ONDANSETRON HCL 4 MG/2ML IJ SOLN: 4.0000 mg | Freq: Once | INTRAMUSCULAR | Status: DC | PRN

## 2023-12-15 MED ORDER — PROPOFOL 10 MG/ML IV BOLUS
INTRAVENOUS | Status: AC
  Filled 2023-12-15: qty 20

## 2023-12-15 MED ORDER — ROCURONIUM BROMIDE 100 MG/10ML IV SOLN
INTRAVENOUS | Status: DC | PRN
  Administered 2023-12-15 (×2): 10 mg via INTRAVENOUS
  Administered 2023-12-15 (×3): 30 mg via INTRAVENOUS
  Administered 2023-12-15: 70 mg via INTRAVENOUS

## 2023-12-15 MED ORDER — GABAPENTIN 300 MG PO CAPS
300.0000 mg | ORAL_CAPSULE | ORAL | Status: AC
  Administered 2023-12-15: 300 mg via ORAL
  Filled 2023-12-15: qty 1

## 2023-12-15 MED ORDER — SUGAMMADEX SODIUM 200 MG/2ML IV SOLN
INTRAVENOUS | Status: AC
  Filled 2023-12-15: qty 4

## 2023-12-15 MED ORDER — DEXMEDETOMIDINE HCL IN NACL 80 MCG/20ML IV SOLN
INTRAVENOUS | Status: DC | PRN
  Administered 2023-12-15: 8 ug via INTRAVENOUS
  Administered 2023-12-15: 4 ug via INTRAVENOUS
  Administered 2023-12-15: 8 ug via INTRAVENOUS
  Administered 2023-12-15 (×3): 4 ug via INTRAVENOUS

## 2023-12-15 MED ORDER — LIDOCAINE HCL (PF) 2 % IJ SOLN
INTRAMUSCULAR | Status: AC
  Filled 2023-12-15: qty 5

## 2023-12-15 MED ORDER — VISTASEAL 4 ML SINGLE DOSE KIT
4.0000 mL | PACK | Freq: Once | CUTANEOUS | Status: AC
  Administered 2023-12-15: 4 mL via TOPICAL
  Filled 2023-12-15: qty 4

## 2023-12-15 MED ORDER — BUPIVACAINE-EPINEPHRINE (PF) 0.25% -1:200000 IJ SOLN
INTRAMUSCULAR | Status: AC
  Filled 2023-12-15: qty 30

## 2023-12-15 MED ORDER — CELECOXIB 200 MG PO CAPS: 200.0000 mg | ORAL_CAPSULE | Freq: Once | ORAL | Status: DC

## 2023-12-15 MED ORDER — ONDANSETRON 4 MG PO TBDP: 4.0000 mg | ORAL_TABLET | Freq: Four times a day (QID) | ORAL | Status: DC | PRN

## 2023-12-15 MED ORDER — LORATADINE 10 MG PO TABS
10.0000 mg | ORAL_TABLET | Freq: Every day | ORAL | Status: DC
  Administered 2023-12-15 – 2023-12-16 (×2): 10 mg via ORAL
  Filled 2023-12-15 (×2): qty 1

## 2023-12-15 MED ORDER — OXYCODONE HCL 5 MG PO TABS
5.0000 mg | ORAL_TABLET | ORAL | Status: DC | PRN
  Administered 2023-12-15: 5 mg via ORAL
  Administered 2023-12-16: 10 mg via ORAL
  Administered 2023-12-16: 5 mg via ORAL
  Administered 2023-12-16 – 2023-12-17 (×4): 10 mg via ORAL
  Filled 2023-12-15 (×3): qty 2
  Filled 2023-12-15: qty 1
  Filled 2023-12-15 (×2): qty 2
  Filled 2023-12-15: qty 1

## 2023-12-15 MED ORDER — EPHEDRINE SULFATE (PRESSORS) 25 MG/5ML IV SOSY
PREFILLED_SYRINGE | INTRAVENOUS | Status: DC | PRN
  Administered 2023-12-15 (×3): 5 mg via INTRAVENOUS

## 2023-12-15 SURGICAL SUPPLY — 54 items
APPLICATOR COTTON TIP 6 STRL (MISCELLANEOUS) IMPLANT
BAG COUNTER SPONGE SURGICOUNT (BAG) IMPLANT
BLADE EXTENDED COATED 6.5IN (ELECTRODE) IMPLANT
BLADE SURG SZ11 CARB STEEL (BLADE) ×2 IMPLANT
CHLORAPREP W/TINT 26 (MISCELLANEOUS) ×2 IMPLANT
CLIP APPLIE 11 MED OPEN (CLIP) IMPLANT
CLIP LIGATING HEMO O LOK GREEN (MISCELLANEOUS) IMPLANT
COVER SURGICAL LIGHT HANDLE (MISCELLANEOUS) ×2 IMPLANT
COVER TIP SHEARS 8 DVNC (MISCELLANEOUS) ×2 IMPLANT
DERMABOND ADVANCED .7 DNX12 (GAUZE/BANDAGES/DRESSINGS) ×2 IMPLANT
DERMABOND ADVANCED .7 DNX6 (GAUZE/BANDAGES/DRESSINGS) IMPLANT
DRAPE ARM DVNC X/XI (DISPOSABLE) ×8 IMPLANT
DRAPE COLUMN DVNC XI (DISPOSABLE) ×2 IMPLANT
DRIVER NDL LRG 8 DVNC XI (INSTRUMENTS) IMPLANT
DRIVER NDL MEGA SUTCUT DVNCXI (INSTRUMENTS) ×2 IMPLANT
ELECT PENCIL ROCKER SW 15FT (MISCELLANEOUS) ×2 IMPLANT
ELECT REM PT RETURN 15FT ADLT (MISCELLANEOUS) ×2 IMPLANT
GAUZE 4X4 16PLY ~~LOC~~+RFID DBL (SPONGE) ×2 IMPLANT
GLOVE BIO SURGEON STRL SZ7.5 (GLOVE) ×4 IMPLANT
GOWN STRL REUS W/ TWL XL LVL3 (GOWN DISPOSABLE) ×4 IMPLANT
HANDLE SUCTION POOLE (INSTRUMENTS) IMPLANT
IRRIGATION SUCT STRKRFLW 2 WTP (MISCELLANEOUS) IMPLANT
KIT BASIN OR (CUSTOM PROCEDURE TRAY) ×2 IMPLANT
KIT TURNOVER KIT A (KITS) ×2 IMPLANT
LHOOK LAP DISP 36CM (ELECTROSURGICAL) IMPLANT
MARKER SKIN DUAL TIP RULER LAB (MISCELLANEOUS) ×2 IMPLANT
MESH SOFT 12X12IN BARD (Mesh General) IMPLANT
MESH VICRYL KNITTED 12X12 (Mesh General) IMPLANT
NDL HYPO 22X1.5 SAFETY MO (MISCELLANEOUS) ×2 IMPLANT
NDL INSUFFLATION 14GA 120MM (NEEDLE) ×2 IMPLANT
OBTURATOR OPTICALSTD 8 DVNC (TROCAR) ×2 IMPLANT
PACK CARDIOVASCULAR III (CUSTOM PROCEDURE TRAY) ×2 IMPLANT
RETAINER VISCERA MED (MISCELLANEOUS) IMPLANT
SCISSORS MNPLR CVD DVNC XI (INSTRUMENTS) ×2 IMPLANT
SEAL UNIV 5-12 XI (MISCELLANEOUS) ×8 IMPLANT
SOLUTION ANTFG W/FOAM PAD STRL (MISCELLANEOUS) ×2 IMPLANT
SOLUTION ELECTROSURG ANTI STCK (MISCELLANEOUS) ×2 IMPLANT
SPIKE FLUID TRANSFER (MISCELLANEOUS) ×2 IMPLANT
SPONGE T-LAP 18X18 ~~LOC~~+RFID (SPONGE) IMPLANT
SUT MNCRL AB 4-0 PS2 18 (SUTURE) ×2 IMPLANT
SUT PDS AB 2-0 CT2 27 (SUTURE) IMPLANT
SUT STRATA PDS 2-0 23 CT-1 (SUTURE) IMPLANT
SUT STRATAFIX 1PDS 45CM VIOLET (SUTURE) IMPLANT
SUT STRATAFIX SPIRAL PDS3-0 (SUTURE) IMPLANT
SUT STRATAFIX SPIRL PDS+ 45CM (SUTURE) IMPLANT
SUT VIC AB 2-0 SH 27XBRD (SUTURE) IMPLANT
SUT VIC AB 3-0 SH 18 (SUTURE) IMPLANT
SUTURE STRATFX 0 PDS+ CT-2 23 (SUTURE) ×4 IMPLANT
SYR 10ML LL (SYRINGE) ×2 IMPLANT
SYR 20ML LL LF (SYRINGE) ×2 IMPLANT
TOWEL GREEN STERILE FF (TOWEL DISPOSABLE) ×2 IMPLANT
TOWEL OR 17X26 10 PK STRL BLUE (TOWEL DISPOSABLE) ×2 IMPLANT
TROCAR Z-THREAD FIOS 5X100MM (TROCAR) ×2 IMPLANT
TUBING INSUFFLATION 10FT LAP (TUBING) ×2 IMPLANT

## 2023-12-15 NOTE — Op Note (Signed)
 Emily Sharp ([id number])  Operative Report  Date 12/15/2023  PREOPERATIVE DIAGNOSIS: Complex Ventral Hernia  POSTOPERATIVE DIAGNOSIS: Same (7x15cm based on intra-op measurement)  SURGEON: Cordella Idler, MD  RESIDENT: Marolyn Big, MD  ANESTHESIA: General   PROCEDURE:   1. Diagnostic laparoscopy. 2. Robotic Muscular release of the left rectus muscle to facilitate retromuscular hernia repair. 3. Robotic Muscular release of the right rectus muscle to facilitate retromuscular hernia repair. 4. Open Rives-Stoppa hernia repair with mesh 23 x 30 cm Bard Soft mesh. 5. Partial explantation of previously placed hernia mesh  INTRAOPERATIVE FINDINGS: Incisional hernia with a fascial defect of 15 cm (craniocaudal) x 7 cm (transverse); total surface area for adjacent tissue transfer: 600 square centimeters.  ESTIMATED BLOOD LOSS:  HERNIA CHARACTERISTICS: Location: incisional Approach: Robotic Initial/Recurrent: recurrent Reducible/Incarcerated: incarcerated Mesh Implantation: Yes - 23x30cm Bard Soft Mesh Hernia Size: 15 x 7cm Total Surface Area for Adjacent Tissue Transfer: 600 square centimeters  IMPLANTS: Implant Name Type Inv. Item Serial No. Manufacturer Lot No. LRB No. Used Action  MESH SOFT 12X12IN BARD - [id number] Mesh General MESH SOFT 12X12IN BARD  [employer] BARD ACCESS [id number] N/A 1 Implanted    COMPLICATIONS: None  SPECIMENS: Hernia sac  OPERATIVE INDICATIONS: Pt is a 43 y.o. female who had undergone a previous incisional hernia repair with mesh after a prior c-section. She developed a recurrence and desired repair.  The risks, benefits and alternatives of the procedure were explained to the patient and the patient agreed to proceed and signed informed consent in front of a witness.   DESCRIPTION OF PROCEDURE: After preoperative identifying the patient in holding, the patient was brought to the operating room and placed supine on the operating room  table. SCDs were placed as indicated. After induction of general anesthesia,  appropriate perioperative antibiotics were administered.  The operative site was prepped and draped in the typical sterile fashion.  A JACHO approved time out, where the name of the patient, the operation, and intended site was confirmed.  The procedure was begun.    The patient was appropriately marked to identify the linea alba as well as the linea semilunaris and, accessing the rectus sheath in the subcostal position, an optical trocar was placed in the left retromuscular space.  Under direct vision with a 5 mm camera, the left retromuscular space was dissected, freeing the rectus muscle from the posterior sheath.  This space was insufflated without incident. Examination revealed no evidence of bleeding and we were able to stay in the retromuscular position.  Three additional ports were placed, one in the left upper quadrant and one in the left flank, and one in the left lower quadrant, all in the retromuscular space.  We turned our attention to creation of the myofascial releases of the abdominal wall to facilitate primary fascial closure via a Robotic Rives-Stoppa approach.  We performed a left-sided myofascial flap via a posterior release, by mobilizing the retrorectus space. The left retromuscular space was released from the posterior sheath to the edge of the linea semilunaris to provide adequate medialization for repair of the ventral hernia.  The belly of the rectus muscle was identified and bluntly separated from the posterior rectus sheath out toward the linea semilunaris. This space was mobilized superiorly above the costal margin. Inferiorly, the retrorectus space was mobilized toward the pubis  taking care to preserve the inferior epigastric vessels.  We confirmed hemostasis.  We found we had about a 10 cm release on the left side, facilitating bringing  the linea alba close to the midline without tension. The right-side  would not medialize enough toward the midline. We again confirmed hemostasis in the posterior rectus sheath.     I identified the linea alba and incised the posterior sheath, allowing me to cross over to the right side of the abdomen in a pre-peritoneal plane. To facilitate tension-free primary fascial closure, we then decided to mobilize a Right-sided myofascial flap via a posterior release.  The belly of the rectus muscle was identified and bluntly separated from the posterior rectus sheath out to the linea semilunaris. The space was mobilized superiorly above the costal margin.   The epigastric vessels were preserved. Inferiorly, the retrorectus space was mobilized toward the pubis and out to the linea semilunaris, taking care to preserve the inferior epigastric vessels.   I encountered the hernia as a moved toward the pelvis. There was a moderate amount of omentum herniated through a swiss cheese type defect along the lower midline. I was able to carefully reduce the contents back into the abdomen. We confirmed hemostasis. We had about a 10 cm release also on this side, which would allow us  to bring the linea alba to midline without tension.     Having completed the myofascial release, I could see that there was a sizeable defect in the posterior sheath.  I wanted to excise the hernia sac and evaluate the mesh that had been placed at the previous operation, so I decided to perform the rest of the operation open. I made a midline incision within her previous scar using a 10 blade. This was carried down through the subcutaneous tissues with electrocautery. The linea was identified and the abdomen. There was no evidence of injury from entry. I found a fairly large hernia sac that protruded into the subcutaneous tissues on the right. I excised this with electrocautery and passed it off a specimen. I examined the omentum that had been reduced from the hernia and found that some of it appeared necrotic. This was  excised as well. I then examined the hernia defect and found what appeared to be an onlay mesh from her previous operation. Most of this was well incorporated. I excised a portion of the mesh on either side of the midline, leaving the well incorporated portions behind.  At this point, both sides would well medialize for tension-free primary fascial closure.  We confirmed hemostasis in these planes. We confirmed all sponge, needle, and instrument counts were reported correct. The resulting posterior sheath defect, after bilateral posterior rectus myofascial releases, measured 15cm x 7cm.  Having reduced the hernia in its entirety during our dissection and mobilization, the dissection revealed evidence of 15 cm x 7 cm herniation along the lower midline.     The abdomen was irrigated with several liters of saline. The posterior sheath was closed with 2-0 PDS. A piece of Bard Soft mesh was brought to the field and cut to fit the space. The mesh measured 30cm x 23cm. The mesh was placed into the retrorectus space. Vistaseal  was applied. Attention was turned to the anterior sheath and the anterior sheath was reapproximated with a 0 absorbable Stratafix Symmetric suture with relative ease. The space was irrigated once again and evaluated for hemostasis. The midline wound was closed in layers, using a running 2-0 vicryl deep dermal layer followed by interrupted 3-0 vicryl. The dermis was closed with 4-0 monocryl. The laparoscopic wounds were closed with 4-0 Monocryl sutures.  Dermabond was applied to all incisions.  After confirming twice that the sponge, needle and instrument counts were correct, the procedure was terminated.  The patient was extubated and transferred to the recovery room in stable condition.  Of note, Dr. Cordella DELENA Idler was present for the entirety of the operation.  DISPOSITION: Stable to PACU.   Electronically Signed By: Cordella DELENA Idler 12/15/2023 12:40 PM

## 2023-12-15 NOTE — H&P (Signed)
 Emily Sharp Jul 12, 1980  [id number].    HPI:  43 y/o F with a recurrent incisional hernia who presents for elective repair. She reports that she is in her usual state of health and denies any recent changes in medication.   ROS: Review of Systems  Constitutional: Negative.   HENT: Negative.    Eyes: Negative.   Respiratory: Negative.    Cardiovascular: Negative.   Gastrointestinal: Negative.   Genitourinary: Negative.   Musculoskeletal: Negative.   Skin: Negative.   Neurological: Negative.   Endo/Heme/Allergies: Negative.   Psychiatric/Behavioral: Negative.      Family History  Problem Relation Age of Onset   Skin cancer Mother    Diabetes Father    Peripheral vascular disease Father    Bladder Cancer Father    Hypertension Father    AAA (abdominal aortic aneurysm) Father    Skin cancer Father    Breast cancer Paternal Aunt    Breast cancer Maternal Grandfather    Skin cancer Maternal Grandfather    Breast cancer Paternal Grandmother     Past Medical History:  Diagnosis Date   Abnormal glucose tolerance test (GTT) during pregnancy, antepartum 04/25/2017   ADHD (attention deficit hyperactivity disorder)    Anxiety and depression    Asthma    Bradycardia 05/09/2016   Dichorionic diamniotic twin pregnancy, antepartum 05/06/2017   Encounter for assisted reproductive fertility procedure cycle    Encounter for fertility testing    Female infertility associated with anovulation    Gestational diabetes    Gestational diabetes 06/10/2017   H/O cesarean section 06/10/2017   Multiple gestation 06/10/2017   PCOS (polycystic ovarian syndrome)    PIH (pregnancy induced hypertension) 06/10/2017   Pneumonia    Polyp of corpus uteri     Past Surgical History:  Procedure Laterality Date   BREAST BIOPSY Right 09/24/2022   US  RT BREAST BX W LOC DEV 1ST LESION IMG BX SPEC US  GUIDE 09/24/2022 [facility]   BREAST REDUCTION SURGERY     CESAREAN SECTION  MULTI-GESTATIONAL N/A 06/10/2017   Procedure: PRIMARY CESAREAN SECTION MULTI-GESTATIONAL;  Surgeon: Curlene Agent, MD;  Location: [facility];  Service: Obstetrics;  Laterality: N/A;  [name], RNFA   DILATION AND CURETTAGE OF UTERUS     HERNIA REPAIR     INCISIONAL HERNIA REPAIR     OVARIAN CYST REMOVAL     WISDOM TOOTH EXTRACTION      Social History:  reports that she has never smoked. She has never used smokeless tobacco. She reports current alcohol use. She reports that she does not use drugs.  Allergies:  Allergies  Allergen Reactions   Sulfa Antibiotics Hives    HIVES, RASH   Ancef  [Cefazolin ] Rash and Cough    Medications Prior to Admission  Medication Sig Dispense Refill   atomoxetine (STRATTERA) 80 MG capsule Take 80 mg by mouth every morning.     cetirizine (ZYRTEC) 10 MG tablet Take 10 mg by mouth daily.     EPINEPHrine  0.3 mg/0.3 mL IJ SOAJ injection Inject 0.3 mg into the muscle as needed for anaphylaxis. 1 each 1   FLUoxetine  (PROZAC ) 40 MG capsule TAKE 1 CAPSULE (40 MG TOTAL) BY MOUTH DAILY. START AFTER FINISHING THE 20MG  PRESCRIPTION. 90 capsule 1   Multiple Vitamin (QUINTABS) TABS Take 1 tablet by mouth daily.     Albuterol -Budesonide  (AIRSUPRA ) 90-80 MCG/ACT AERO Inhale 2 puffs into the lungs every 4 (four) hours as needed (coughing, wheezing, chest tightness). Do not  exceed 12 puffs in 24 hours. 10.7 g 2   fluticasone  (FLONASE ) 50 MCG/ACT nasal spray Place 1 spray into both nostrils daily. Begin by using 2 sprays in each nare daily for 3 to 5 days, then decrease to 1 spray in each nare daily. (Patient not taking: Reported on 11/25/2023) 32 mL 1    Physical Exam: Blood pressure 132/86, pulse 85, temperature 98.7 F (37.1 C), temperature source Oral, resp. rate 16, height 5' 8 (1.727 m), weight 122.5 kg, SpO2 98%. Gen: female, NAD Abd: soft, non-distended, hernia marked  Results for orders placed or performed during the hospital encounter of 12/15/23 (from  the past 48 hours)  Pregnancy, urine POC     Status: None   Collection Time: 12/15/23  6:20 AM  Result Value Ref Range   Preg Test, Ur NEGATIVE NEGATIVE    Comment:        THE SENSITIVITY OF THIS METHODOLOGY IS >20 mIU/mL.    No results found.  Assessment/Plan 44 y/o F with a recurrent incisional hernia  - Will proceed to the OR. We discussed the alternatives and potential risks of surgery, including but not limited to: bleeding, infection, damage to bowel or surrounding structures, mesh complications, chronic pain, recurrent hernia, and need for additional procedures. All questions were addressed and consent was obtained.    Emily Sharp Surgery 12/15/2023, 6:54 AM Please see Amion for pager number during day hours 7:00am-4:30pm or 7:00am -11:30am on weekends

## 2023-12-15 NOTE — Anesthesia Postprocedure Evaluation (Signed)
 Anesthesia Post Note  Patient: Emily Sharp  Procedure(s) Performed: REPAIR, HERNIA, VENTRAL, ROBOT-ASSISTED     Patient location during evaluation: PACU Anesthesia Type: General Level of consciousness: awake and alert Pain management: pain level controlled Vital Signs Assessment: post-procedure vital signs reviewed and stable Respiratory status: spontaneous breathing, nonlabored ventilation, respiratory function stable and patient connected to nasal cannula oxygen Cardiovascular status: blood pressure returned to baseline and stable Postop Assessment: no apparent nausea or vomiting Anesthetic complications: no   No notable events documented.  Last Vitals:  Vitals:   12/15/23 1330 12/15/23 1345  BP: 116/70 129/76  Pulse: 87 83  Resp: 11 13  Temp:    SpO2: 96% 94%    Last Pain:  Vitals:   12/15/23 1345  TempSrc:   PainSc: 3                  [name]

## 2023-12-15 NOTE — Plan of Care (Signed)
   Problem: Activity: Goal: Risk for activity intolerance will decrease Outcome: Progressing   Problem: Nutrition: Goal: Adequate nutrition will be maintained Outcome: Progressing   Problem: Coping: Goal: Level of anxiety will decrease Outcome: Progressing

## 2023-12-15 NOTE — Transfer of Care (Signed)
 Immediate Anesthesia Transfer of Care Note  Patient: Emily Sharp  Procedure(s) Performed: REPAIR, HERNIA, VENTRAL, ROBOT-ASSISTED  Patient Location: PACU  Anesthesia Type:General  Level of Consciousness: awake  Airway & Oxygen Therapy: Patient Spontanous Breathing and Patient connected to nasal cannula oxygen  Post-op Assessment: Report given to RN and Post -op Vital signs reviewed and stable  Post vital signs: Reviewed and stable  Last Vitals:  Vitals Value Taken Time  BP 122/72 12/15/23 13:00  Temp    Pulse 91 12/15/23 13:02  Resp 22 12/15/23 13:02  SpO2 97 % 12/15/23 13:02  Vitals shown include unfiled device data.  Last Pain:  Vitals:   12/15/23 0602  TempSrc: Oral  PainSc: 0-No pain         Complications: No notable events documented.

## 2023-12-15 NOTE — Anesthesia Procedure Notes (Signed)
 Procedure Name: Intubation Date/Time: 12/15/2023 7:34 AM  Performed by: Belvie Valri NOVAK, CRNAPre-anesthesia Checklist: Patient identified, Emergency Drugs available, Suction available and Patient being monitored Patient Re-evaluated:Patient Re-evaluated prior to induction Oxygen Delivery Method: Circle System Utilized Preoxygenation: Pre-oxygenation with 100% oxygen Induction Type: IV induction and Rapid sequence Ventilation: Mask ventilation without difficulty and Oral airway inserted - appropriate to patient size Laryngoscope Size: Mac and 3 Grade View: Grade I Tube type: Oral Tube size: 7.0 mm Number of attempts: 1 Airway Equipment and Method: Stylet and Oral airway Placement Confirmation: ETT inserted through vocal cords under direct vision, positive ETCO2 and breath sounds checked- equal and bilateral Secured at: 22 cm Tube secured with: Tape Dental Injury: Teeth and Oropharynx as per pre-operative assessment

## 2023-12-16 ENCOUNTER — Encounter (HOSPITAL_COMMUNITY): Payer: Self-pay | Admitting: General Surgery

## 2023-12-16 LAB — CBC
HCT: 35.6 % — ABNORMAL LOW (ref 36.0–46.0)
Hemoglobin: 12.1 g/dL (ref 12.0–15.0)
MCH: 29.6 pg (ref 26.0–34.0)
MCHC: 34 g/dL (ref 30.0–36.0)
MCV: 87 fL (ref 80.0–100.0)
Platelets: 249 K/uL (ref 150–400)
RBC: 4.09 MIL/uL (ref 3.87–5.11)
RDW: 13.2 % (ref 11.5–15.5)
WBC: 10.6 K/uL — ABNORMAL HIGH (ref 4.0–10.5)
nRBC: 0 % (ref 0.0–0.2)

## 2023-12-16 LAB — BASIC METABOLIC PANEL WITH GFR
Anion gap: 14 (ref 5–15)
BUN: 9 mg/dL (ref 6–20)
CO2: 20 mmol/L — ABNORMAL LOW (ref 22–32)
Calcium: 9.3 mg/dL (ref 8.9–10.3)
Chloride: 103 mmol/L (ref 98–111)
Creatinine, Ser: 0.62 mg/dL (ref 0.44–1.00)
GFR, Estimated: >60 mL/min (ref >60)
Glucose, Bld: 120 mg/dL — ABNORMAL HIGH (ref 70–99)
Potassium: 3.9 mmol/L (ref 3.5–5.1)
Sodium: 137 mmol/L (ref 135–145)

## 2023-12-16 LAB — SURGICAL PATHOLOGY

## 2023-12-16 MED ORDER — METHOCARBAMOL 500 MG PO TABS
500.0000 mg | ORAL_TABLET | Freq: Three times a day (TID) | ORAL | Status: DC
  Administered 2023-12-16 (×3): 500 mg via ORAL
  Filled 2023-12-16 (×3): qty 1

## 2023-12-16 MED ORDER — DOCUSATE SODIUM 100 MG PO CAPS
100.0000 mg | ORAL_CAPSULE | Freq: Two times a day (BID) | ORAL | Status: DC
  Administered 2023-12-16 (×2): 100 mg via ORAL
  Filled 2023-12-16 (×2): qty 1

## 2023-12-16 MED ORDER — SENNA 8.6 MG PO TABS
2.0000 | ORAL_TABLET | Freq: Every day | ORAL | Status: DC
  Administered 2023-12-16: 17.2 mg via ORAL
  Filled 2023-12-16: qty 2

## 2023-12-16 MED ORDER — POLYETHYLENE GLYCOL 3350 17 G PO PACK
17.0000 g | PACK | Freq: Every day | ORAL | Status: DC
  Administered 2023-12-16: 17 g via ORAL
  Filled 2023-12-16: qty 1

## 2023-12-16 NOTE — Plan of Care (Signed)
  Problem: Activity: Goal: Risk for activity intolerance will decrease Outcome: Progressing   Problem: Nutrition: Goal: Adequate nutrition will be maintained Outcome: Adequate for Discharge   Problem: Coping: Goal: Level of anxiety will decrease Outcome: Progressing   Problem: Elimination: Goal: Will not experience complications related to urinary retention Outcome: Adequate for Discharge

## 2023-12-16 NOTE — Progress Notes (Signed)
    1 Day Post-Op  Subjective: Experienced significant muscle cramping with sudden movement. Otherwise pain has been relatively well controlled. OOB to bathroom. Tolerating PO.   ROS: See above, otherwise other systems negative  Objective: Vital signs in last 24 hours: Temp:  [97.9 F (36.6 C)-99.2 F (37.3 C)] 98.6 F (37 C) (12/09 0544) Pulse Rate:  [77-95] 87 (12/09 0544) Resp:  [11-18] 16 (12/09 0544) BP: (116-163)/(70-111) 134/82 (12/09 0544) SpO2:  [94 %-100 %] 99 % (12/09 0544) Last BM Date : 12/13/23  Intake/Output from previous day: 12/08 0701 - 12/09 0700 In: 4189.9 [P.O.:600; I.V.:3289.9; IV Piggyback:300] Out: 2050 [Urine:1850; Blood:200] Intake/Output this shift: No intake/output data recorded.  PE: Gen: female, NAD Abd: soft, non-distended, port sites and midline incision clean and dry with minimal ecchymosis, no fluctuance, no drainage  Lab Results:  Recent Labs    12/15/23 2059 12/16/23 0514  WBC 12.7* 10.6*  HGB 13.4 12.1  HCT 40.7 35.6*  PLT 270 249   BMET Recent Labs    12/15/23 1502 12/16/23 0514  NA  --  137  K  --  3.9  CL  --  103  CO2  --  20*  GLUCOSE  --  120*  BUN  --  9  CREATININE 0.71 0.62  CALCIUM  --  9.3   PT/INR No results for input(s): LABPROT, INR in the last 72 hours. CMP     Component Value Date/Time   NA 137 12/16/2023 0514   K 3.9 12/16/2023 0514   CL 103 12/16/2023 0514   CO2 20 (L) 12/16/2023 0514   GLUCOSE 120 (H) 12/16/2023 0514   BUN 9 12/16/2023 0514   CREATININE 0.62 12/16/2023 0514   CALCIUM 9.3 12/16/2023 0514   PROT 7.5 05/22/2021 1418   ALBUMIN  3.8 05/22/2021 1418   AST 19 05/22/2021 1418   ALT 11 05/22/2021 1418   ALKPHOS 67 05/22/2021 1418   BILITOT 0.4 05/22/2021 1418   GFRNONAA >60 12/16/2023 0514   GFRAA >60 06/12/2017 0609   Lipase  No results found for: LIPASE  Studies/Results: No results found.  Anti-infectives: Anti-infectives (From admission, onward)    Start      Dose/Rate Route Frequency Ordered Stop   12/15/23 0722  clindamycin  (CLEOCIN ) 900 MG/50ML IVPB       Note to Pharmacy: Servando Deward Mayo: cabinet override      12/15/23 0722 12/15/23 0753   12/15/23 0600  ceFAZolin  (ANCEF ) IVPB 2g/100 mL premix  Status:  Discontinued        2 g 200 mL/hr over 30 Minutes Intravenous On call to O.R. 12/15/23 0545 12/15/23 0829       Assessment/Plan  43 y/o F POD 1 from robotic assisted retrorectus incisional hernia repair  - Stop MIVF - OOB and ambulating in the halls - Schedule robaxin  - d/c MIVF   LOS: 1 day   Emily Sharp Polly Marlis Cheron Surgery 12/16/2023, 8:15 AM Please see Amion for pager number during day hours 7:00am-4:30pm or 7:00am -11:30am on weekends

## 2023-12-16 NOTE — Plan of Care (Signed)
  Problem: Education: Goal: Knowledge of General Education information will improve Description: Including pain rating scale, medication(s)/side effects and non-pharmacologic comfort measures 12/16/2023 1901 by Estelle Ashley BIRCH, RN Outcome: Progressing 12/16/2023 1901 by Estelle Ashley BIRCH, RN Outcome: Progressing   Problem: Health Behavior/Discharge Planning: Goal: Ability to manage health-related needs will improve Outcome: Progressing   Problem: Clinical Measurements: Goal: Ability to maintain clinical measurements within normal limits will improve Outcome: Progressing   Problem: Activity: Goal: Risk for activity intolerance will decrease 12/16/2023 1901 by Estelle Ashley BIRCH, RN Outcome: Progressing 12/16/2023 1901 by Estelle Ashley BIRCH, RN Outcome: Progressing

## 2023-12-16 NOTE — Progress Notes (Signed)
   12/16/23 1040  TOC Brief Assessment  Insurance and Status Reviewed  Patient has primary care physician Yes  Home environment has been reviewed resides in a private residence  Prior level of function: Independent  Prior/Current Home Services No current home services  Social Drivers of Health Review SDOH reviewed no interventions necessary  Readmission risk has been reviewed Yes  Transition of care needs no transition of care needs at this time

## 2023-12-16 NOTE — Plan of Care (Signed)
   Problem: Education: Goal: Knowledge of General Education information will improve Description: Including pain rating scale, medication(s)/side effects and non-pharmacologic comfort measures Outcome: Progressing   Problem: Health Behavior/Discharge Planning: Goal: Ability to manage health-related needs will improve Outcome: Progressing   Problem: Activity: Goal: Risk for activity intolerance will decrease Outcome: Progressing

## 2023-12-17 ENCOUNTER — Other Ambulatory Visit (HOSPITAL_COMMUNITY): Payer: Self-pay

## 2023-12-17 LAB — CBC
HCT: 34.1 % — ABNORMAL LOW (ref 36.0–46.0)
Hemoglobin: 11.1 g/dL — ABNORMAL LOW (ref 12.0–15.0)
MCH: 29.5 pg (ref 26.0–34.0)
MCHC: 32.6 g/dL (ref 30.0–36.0)
MCV: 90.7 fL (ref 80.0–100.0)
Platelets: 199 K/uL (ref 150–400)
RBC: 3.76 MIL/uL — ABNORMAL LOW (ref 3.87–5.11)
RDW: 13.3 % (ref 11.5–15.5)
WBC: 8.7 K/uL (ref 4.0–10.5)
nRBC: 0 % (ref 0.0–0.2)

## 2023-12-17 LAB — BASIC METABOLIC PANEL WITH GFR
Anion gap: 11 (ref 5–15)
BUN: 8 mg/dL (ref 6–20)
CO2: 23 mmol/L (ref 22–32)
Calcium: 8.5 mg/dL — ABNORMAL LOW (ref 8.9–10.3)
Chloride: 105 mmol/L (ref 98–111)
Creatinine, Ser: 0.66 mg/dL (ref 0.44–1.00)
GFR, Estimated: >60 mL/min (ref >60)
Glucose, Bld: 133 mg/dL — ABNORMAL HIGH (ref 70–99)
Potassium: 3.3 mmol/L — ABNORMAL LOW (ref 3.5–5.1)
Sodium: 139 mmol/L (ref 135–145)

## 2023-12-17 MED ORDER — ACETAMINOPHEN 325 MG PO TABS
650.0000 mg | ORAL_TABLET | Freq: Four times a day (QID) | ORAL | 0 refills | Status: AC
  Filled 2023-12-17: qty 50, 6d supply, fill #0

## 2023-12-17 MED ORDER — OXYCODONE HCL 5 MG PO TABS
5.0000 mg | ORAL_TABLET | Freq: Three times a day (TID) | ORAL | 0 refills | Status: DC | PRN
  Filled 2023-12-17: qty 15, 5d supply, fill #0

## 2023-12-17 MED ORDER — IBUPROFEN 200 MG PO TABS
600.0000 mg | ORAL_TABLET | Freq: Four times a day (QID) | ORAL | 0 refills | Status: AC
  Filled 2023-12-17: qty 75, 6d supply, fill #0

## 2023-12-17 MED ORDER — METHOCARBAMOL 500 MG PO TABS
500.0000 mg | ORAL_TABLET | Freq: Four times a day (QID) | ORAL | 0 refills | Status: AC | PRN
  Filled 2023-12-17: qty 30, 8d supply, fill #0

## 2023-12-17 NOTE — Progress Notes (Signed)
 Patient provided education on incision care, all questions and concerns answered prior to discharge. Meds brought to patient's room. Patient discharged through front entrance via wheelchair with her mother and Nurse tech [employer].

## 2023-12-17 NOTE — Progress Notes (Signed)
° ° °  2 Days Post-Op  Subjective: Pain is well controlled. Tolerating PO. Has been ambulating in the halls. Hb 11.1 this morning with bruising about the same. She wants to go home.    ROS: See above, otherwise other systems negative  Objective: Vital signs in last 24 hours: Temp:  [98 F (36.7 C)-98.6 F (37 C)] 98.5 F (36.9 C) (12/10 0505) Pulse Rate:  [87-92] 87 (12/10 0505) Resp:  [18] 18 (12/10 0505) BP: (133-140)/(69-81) 135/81 (12/10 0505) SpO2:  [93 %-100 %] 93 % (12/10 0505) Last BM Date : 12/13/23  Intake/Output from previous day: 12/09 0701 - 12/10 0700 In: 1560 [P.O.:1560] Out: 2300 [Urine:2300] Intake/Output this shift: No intake/output data recorded.  PE: Gen: female, NAD Abd: soft, non-distended, port sites and midline incision clean and dry with minimal ecchymosis, no fluctuance, no drainage  Lab Results:  Recent Labs    12/16/23 0514 12/17/23 0448  WBC 10.6* 8.7  HGB 12.1 11.1*  HCT 35.6* 34.1*  PLT 249 199   BMET Recent Labs    12/16/23 0514 12/17/23 0448  NA 137 139  K 3.9 3.3*  CL 103 105  CO2 20* 23  GLUCOSE 120* 133*  BUN 9 8  CREATININE 0.62 0.66  CALCIUM 9.3 8.5*   PT/INR No results for input(s): LABPROT, INR in the last 72 hours. CMP     Component Value Date/Time   NA 139 12/17/2023 0448   K 3.3 (L) 12/17/2023 0448   CL 105 12/17/2023 0448   CO2 23 12/17/2023 0448   GLUCOSE 133 (H) 12/17/2023 0448   BUN 8 12/17/2023 0448   CREATININE 0.66 12/17/2023 0448   CALCIUM 8.5 (L) 12/17/2023 0448   PROT 7.5 05/22/2021 1418   ALBUMIN  3.8 05/22/2021 1418   AST 19 05/22/2021 1418   ALT 11 05/22/2021 1418   ALKPHOS 67 05/22/2021 1418   BILITOT 0.4 05/22/2021 1418   GFRNONAA >60 12/17/2023 0448   GFRAA >60 06/12/2017 0609   Lipase  No results found for: LIPASE  Studies/Results: No results found.  Anti-infectives: Anti-infectives (From admission, onward)    Start     Dose/Rate Route Frequency Ordered Stop   12/15/23  0722  clindamycin  (CLEOCIN ) 900 MG/50ML IVPB       Note to Pharmacy: Servando Deward Mayo: cabinet override      12/15/23 0722 12/15/23 0753   12/15/23 0600  ceFAZolin  (ANCEF ) IVPB 2g/100 mL premix  Status:  Discontinued        2 g 200 mL/hr over 30 Minutes Intravenous On call to O.R. 12/15/23 0545 12/15/23 0829       Assessment/Plan  43 y/o F POD 2 from robotic assisted retrorectus incisional hernia repair  - Plan for DC this morning   LOS: 2 days   [name] Surgery 12/17/2023, 7:37 AM Please see Amion for pager number during day hours 7:00am-4:30pm or 7:00am -11:30am on weekends

## 2023-12-17 NOTE — Discharge Summary (Signed)
 Physician Discharge Summary  Patient ID: Emily Sharp MRN: [id number] DOB/AGE: 1980-05-23 43 y.o.  Admit date: 12/15/2023 Discharge date: 12/17/2023  Admission Diagnoses:  Discharge Diagnoses:  Principal Problem:   Incisional hernia   Discharged Condition: stable  Hospital Course: 43 y/o F with a history of previous c-section complicated by incisional hernia who had a repair at another hospital. She developed a recurrence and was evaluated in the office. I brought her to the OR for an elective retrorectus repair of her recurrent incisional hernia. There was no immediate complications from surgery, but she was admitted for post-op recovery. She was started on a regular diet and her activity level was gradually increased. Her Hb drifted from 13 to 11 but she did not have signs of significant bleeding. She discharged to home on POD 2. At the time of discharge she was ambulating in the halls, tolerating PO, and her pain was well controlled. She will follow up in the office in 3 weeks.   Discharge Exam: Blood pressure 135/81, pulse 87, temperature 98.5 F (36.9 C), temperature source Oral, resp. rate 18, height 5' 8 (1.727 m), weight 122.5 kg, SpO2 93%. Gen: female, NAD Abd: port sites and midline incision clean and dry with dermabond intact, minimal ecchymosis, no fluctuance, no drainage, no signs of large hematoma  Disposition:    Allergies as of 12/17/2023       Reactions   Sulfa Antibiotics Hives   HIVES, RASH   Ancef  [cefazolin ] Rash, Cough        Medication List     TAKE these medications    acetaminophen  325 MG tablet Commonly known as: Tylenol  Take 2 tablets (650 mg total) by mouth every 6 (six) hours for 6 days.   Airsupra  90-80 MCG/ACT Aero Generic drug: Albuterol -Budesonide  Inhale 2 puffs into the lungs every 4 (four) hours as needed (coughing, wheezing, chest tightness). Do not exceed 12 puffs in 24 hours.   atomoxetine 80 MG capsule Commonly known as:  STRATTERA Take 80 mg by mouth every morning.   cetirizine 10 MG tablet Commonly known as: ZYRTEC Take 10 mg by mouth daily.   EPINEPHrine  0.3 mg/0.3 mL Soaj injection Commonly known as: EPI-PEN Inject 0.3 mg into the muscle as needed for anaphylaxis.   FLUoxetine  40 MG capsule Commonly known as: PROZAC  TAKE 1 CAPSULE (40 MG TOTAL) BY MOUTH DAILY. START AFTER FINISHING THE 20MG  PRESCRIPTION.   fluticasone  50 MCG/ACT nasal spray Commonly known as: FLONASE  Place 1 spray into both nostrils daily. Begin by using 2 sprays in each nare daily for 3 to 5 days, then decrease to 1 spray in each nare daily.   ibuprofen  200 MG tablet Commonly known as: Motrin  IB Take 3 tablets (600 mg total) by mouth every 6 (six) hours for 6 days.   methocarbamol  500 MG tablet Commonly known as: ROBAXIN  Take 1 tablet (500 mg total) by mouth every 6 (six) hours as needed for up to 8 days for muscle spasms.   oxyCODONE  5 MG immediate release tablet Commonly known as: Roxicodone  Take 1 tablet (5 mg total) by mouth every 8 (eight) hours as needed.   Quintabs Tabs Take 1 tablet by mouth daily.         Signed: [name] 12/17/2023, 7:46 AM

## 2023-12-17 NOTE — Discharge Instructions (Signed)
 Home Care After Hernia Repair   Activity  You may return to most normal daily activities. Returning to normal daily activities as soon as you can following surgery will enhance recovery time.  No heavy lifting pushing or pulling, anything heavier than 10 pounds (gallon of milk weighs approx. 8.8 pounds) for 4-6 weeks from surgery date.  Do not mow the lawn, use a vacuum cleaner, or do any other strenuous activities without first consulting your surgical team.  Climb stairs slowly and watch your step.  Walk as often as you feel able to increase strength and endurance.  No driving or operating heavy machinery within 24 hours of taking narcotic pain medication.  Diet  Drink plenty of fluids and eat a light meal on the night of surgery. Some patients may find their appetite is poor for a week or two after surgery. This is a normal result of the stress of surgery-your appetite will return in time.   There are no specific diet restrictions after surgery.  Dressings and Wound Care  Dermabond/Durabond (skin glue): This will usually remain in place for 10-14 days, then naturally fall off your skin. You may take a shower 24 hrs after  surgery, carefully wash, not scrub the incision site with a mild non-scented soap. Pat dry with a soft towel.  Do not pick or peel skin glue off.  You can shower and let the water  fall on the dressings above. Do not soak or submerge your incision(s) in a bath tub, hot tub, or swimming pool, until your doctor says it is ok to do so or the incision(s) have completely healed, usually about 2-4 weeks.  Do not use creams, powder, salves or balms on your incision(s).  What to Expect After Surgery   Moderate discomfort controlled with medications  Minimal drainage from incision  You may feel pain in one or both shoulders. This pain comes from the gas still left in your belly after the surgery, if you had laparoscopic surgery (several small incisions). The pain should ease over  several days to a week. Ambulation will help with this pain.   Belly swelling  Feeling fatigue and weak  Constipation after surgery is common. Drink plenty fluids and eat a high fiber diet.  Swelling - In some patients might feel that their hernia has returned after surgery-DO NOT Worry this is normal. Swelling may be due to the development of a seroma. Seroma is fluid that has built up where the hernia was repaired this is a normal result after surgery and it will slowly reabsorb back into your body over the next several weeks.   (FEMALE PATIENTS ONLY)-esp following inguinal hernia repair, it is expected that your scrotum may be slightly swollen or tender. Along with oral NSAIDS medications you can apply ice packs, wear compression shorts, and/or elevate scrotum using a rolled up towel.  Also, a bladder catheter may have been placed during your surgery.  Because of this, it may hurt to urinate for a couple of days or you may pass some blood clots. These issues are expected and will go away after a few days. Please notify surgeon or report to Emergency Dept. if you are unable to urinate or your symptoms worsen.  Pain Control: Prescribed Non-Narcotic Pain Medication  You will be given three prescriptions.  Two of them will be for prescription strength ibuprofen  (i.e. Advil ) and prescription strength acetaminophen  (i.e. Tylenol ).  The vast majority of patients will just need these two medications.  One  prescription will be for a 'rescue' prescription of an oral narcotic (oxycodone ).  You may fill this if needed.  You will alternate taking the ibuprofen  (600mg ) every 6 hours and also the acetaminophen  (650mg ) every 6 hours so that you are taking one of those medications every 3 hours.  For example: o 0800 - take ibuprofen  600mg  o 1100 - take acetaminophen  650mg  o 1400 - take ibuprofen  600mg  o 1700 - take acetaminophen  650mg  o Etc  Continue taking this alternating pattern of ibuprofen  and acetaminophen   for 3 days  If you cannot take one or the other of these medications, just take the one you can every 6 hours.  If you are comfortable at night, you don't have to wake up and take a medication.  If you are still uncomfortable after taking either ibuprofen  or acetaminophen , try gentle stretching exercise and ice packs (a bag of frozen vegetables works great).  If you are still uncomfortable, you may fill the narcotic prescription of Oxycodone  and take as directed.  Once you have completed these prescriptions, your pain level should be low enough to stop taking medications altogether or just use an over the counter medication (ibuprofen  or acetaminophen ) as needed.   Pain Control: Over the Counter Medications to take as needed:  Colace/Docusate: May be prescribed by your surgeon to prevent constipation caused by the combination of narcotics, effects of anesthesia, and decreased ambulation.  Hold for loose stools or diarrhea. Take 100 mg 1-2 times a day starting tonight.   Fiber: High fiber foods, extra liquids (water  9-13 cups/day) can also assist with constipation. Examples of high fiber foods are fruit, bran. Prune juice and water  are also good liquids to drink.  Milk of Magnesia/Miralax :  If constipated despite take the over the counter stool softeners, you may take Milk of Magnesia or Miralax  as directed on bottle to assist with constipation.     Pepcid/Famotidine: May be prescribed while taking naproxen (Aleve) or other NSAIDs such as ibuprofen  (Motrin /Advil ) to prevent stomach upset or Acid-reflux symptoms. Take 1 tablet 1-2 times a day.   **Constipation: The first bowel movement may occur anywhere between 1-5 days after surgery.  As long as you are not nauseated or not having significant abdominal pain this variation is acceptable.  Narcotic pain medications can cause constipation increasing discomfort; early discontinuation will assist with bowel management.If constipated despite taking stool  softeners, you may take Milk of Magnesia or Miralax  as directed on the bottle.     **Home medications: You may restart your home medications as directed by your respective Primary Care Physician or Surgeon.  When to notify your Doctor or Healthcare Team:   Sign of Wound Infection   Fever over 100 degrees.  Wound becomes extremely swollen, shows red streaks, warm to the touch, and/or drainage from the incision site or foul-smelling drainage.  Wound edges separate or opens up  Bleeding or bruising   If you have bleeding, apply pressure to the site and hold the pressure firmly for 5 minutes. If the bleeding continues, apply pressure again and call 911. If the bleeding stopped, call your doctor to report it.   Call your doctor or nurse if you have increased bleeding from your site and increased bruising or a lump forms or gets larger under your skin at the site.  If you notice sudden changes to your wound and/or develop a fast heart rate or sensation of feeling faint or fatigued then you should see immediate assistance with 911.  Unrelieved Pain    Call your doctor or nurse if your pain gets worse or is not eased 1 hour after taking your pain medicine, or if it is severe and uncontrolled.  Nausea and Vomiting   Call your doctor or nurse if you have nausea and vomiting that continues more than 24 hours, will not let you keep medicine down and will not let you keep fluids down  Fever, Flu-like symptoms   Fever over 100 degrees and/or chills  Gastrointestinal Bleeding Symptoms    Black tarry bowel movements.     Call 911 if you suddenly have signs of blood loss such as:  Vomiting blood  Fast heart rate  Feeling faint, sweaty, or blacking out  Passing bright red blood from your rectum  Blood Clot Symptoms   Tender, swollen or reddened areas in your calf muscle or thighs.  Numbness or tingling in your lower leg or calf, or at the top of your leg or groin  Skin on your leg looks pale or  blue or feels cold to touch  Chest pain or have trouble breathing, lightheadedness, fast heart rate  Sudden Onset of Symptoms    Call 911 if you suddenly have:  Leg weakness and spasm  Loss of bladder or bowel function  Seizure  Confusion, severe headache, dizziness or feeling unsteady, problems talking, difficulty swallowing, and/or numbness or muscle weakness as these could be signs of a stroke.   Follow up Appointment Your follow up appointment should be scheduled 2-3 weeks after your surgery date.  If you have not previously scheduled for a follow-up visit you can be scheduled by contacting [phone].

## 2023-12-17 NOTE — Progress Notes (Signed)
 Discharge medications deliverd to patient at the bedside

## 2023-12-17 NOTE — Plan of Care (Signed)

## 2024-01-05 ENCOUNTER — Other Ambulatory Visit (HOSPITAL_COMMUNITY): Payer: Self-pay | Admitting: General Surgery

## 2024-01-05 DIAGNOSIS — G8918 Other acute postprocedural pain: Secondary | ICD-10-CM

## 2024-01-07 ENCOUNTER — Ambulatory Visit (HOSPITAL_COMMUNITY): Admission: RE | Admit: 2024-01-07 | Discharge: 2024-01-07 | Attending: General Surgery

## 2024-01-07 DIAGNOSIS — G8918 Other acute postprocedural pain: Secondary | ICD-10-CM | POA: Diagnosis present

## 2024-01-07 MED ORDER — IOHEXOL 9 MG/ML PO SOLN
500.0000 mL | ORAL | Status: AC
  Administered 2024-01-07 (×2): 500 mL via ORAL

## 2024-01-07 MED ORDER — IOHEXOL 300 MG/ML  SOLN
100.0000 mL | Freq: Once | INTRAMUSCULAR | Status: AC | PRN
  Administered 2024-01-07: 100 mL via INTRAVENOUS

## 2024-01-09 ENCOUNTER — Other Ambulatory Visit: Payer: Self-pay | Admitting: Student

## 2024-01-09 ENCOUNTER — Other Ambulatory Visit: Payer: Self-pay

## 2024-01-09 ENCOUNTER — Other Ambulatory Visit (HOSPITAL_COMMUNITY): Payer: Self-pay

## 2024-01-09 ENCOUNTER — Other Ambulatory Visit: Payer: Self-pay | Admitting: General Surgery

## 2024-01-09 ENCOUNTER — Ambulatory Visit (HOSPITAL_COMMUNITY)
Admission: RE | Admit: 2024-01-09 | Discharge: 2024-01-09 | Disposition: A | Discharge status: Home / Self Care | Source: Ambulatory Visit | Attending: General Surgery | Admitting: General Surgery

## 2024-01-09 VITALS — BP 124/83 | HR 90 | Temp 99.0°F | Resp 17 | Ht 68.0 in | Wt 265.0 lb

## 2024-01-09 DIAGNOSIS — K439 Ventral hernia without obstruction or gangrene: Secondary | ICD-10-CM

## 2024-01-09 DIAGNOSIS — L7682 Other postprocedural complications of skin and subcutaneous tissue: Secondary | ICD-10-CM | POA: Insufficient documentation

## 2024-01-09 DIAGNOSIS — Z8719 Personal history of other diseases of the digestive system: Secondary | ICD-10-CM

## 2024-01-09 DIAGNOSIS — R188 Other ascites: Secondary | ICD-10-CM

## 2024-01-09 HISTORY — PX: IR US GUIDE BX ASP/DRAIN: IMG2392

## 2024-01-09 LAB — PREGNANCY, URINE: Preg Test, Ur: NEGATIVE

## 2024-01-09 MED ORDER — LIDOCAINE-EPINEPHRINE 1 %-1:100000 IJ SOLN
INTRAMUSCULAR | Status: AC
  Filled 2024-01-09: qty 1

## 2024-01-09 MED ORDER — DOXYCYCLINE HYCLATE 100 MG PO TABS: 100.0000 mg | ORAL_TABLET | Freq: Two times a day (BID) | ORAL | 0 refills | Status: AC

## 2024-01-09 MED ORDER — LIDOCAINE-EPINEPHRINE 1 %-1:100000 IJ SOLN
20.0000 mL | Freq: Once | INTRAMUSCULAR | Status: AC
  Administered 2024-01-09: 10 mL via INTRADERMAL

## 2024-01-09 MED ORDER — SODIUM CHLORIDE 0.9% FLUSH
10.0000 mL | Freq: Every day | INTRAVENOUS | 0 refills | Status: DC
  Filled 2024-01-09: qty 300, 30d supply, fill #0

## 2024-01-09 NOTE — Procedures (Signed)
 Interventional Radiology Procedure:   Indications: Post operative abdominal wall fluid collection  Procedure: US  guided drain placement  Findings: 12 Fr drain placed in complex anterior abdominal wall collection.  250 ml of brown fluid aspirated.     Complications: None     EBL: Minimal  Plan:  Flush drain daily.  Follow up with Dr. Polly and drain clinic.    Jaishaun Mcnab R. Philip, MD  Pager: [phone]

## 2024-01-09 NOTE — Discharge Instructions (Signed)
 Interventional Radiology Percutaneous Abscess Drain Placement After Care   This sheet gives you information about how to care for yourself after your procedure. Your health care provider may also give you more specific instructions. Your drain was placed by an interventional radiologist with [facility]. If you have questions or concerns, contact [facility] at [phone].   What is a percutaneous drain?   A drain is a small plastic tube (catheter) that goes into the fluid collection in your body through your skin.   How long will I need the drain?   How long the drain needs to stay in is determined by where the drain is, how much comes out of the drain each day and if you are having any other surgical procedures.   Interventional radiology will determine when it is time to remove the drain. It is important to follow up as directed so that the drain can be removed as soon as it is safe to do so.   What can I expect after the procedure?   After the procedure, it is common to have:   A small amount of bruising and discomfort in the area where the drainage tube (catheter) was placed.   Sleepiness and fatigue. This should go away after the medicines you were given have worn off.   Follow these instructions at home:   Insertion site care   Check your insertion site when you change the bandage. Check for:   More redness, swelling, or pain.   More fluid or blood.   Warmth.   Pus or a bad smell.   When caring for your insertion site:   Wash your hands with soap and water for at least 20 seconds before and after you change your bandage (dressing). If soap and water are not available, use hand sanitizer.   You do not need to change your dressing everyday if it is clean and dry. Change your dressing every 3 days or as needed when it is soiled, wet or becoming dislodged. You will need to change your dressing each time you shower.   Leave stitches (sutures), skin  glue, or adhesive strips in place. These skin closures may need to stay in place for 2 weeks or longer. If adhesive strip edges start to loosen and curl up, you may trim the loose edges. Do not remove adhesive strips completely unless your health care provider tells you to do so.   Catheter care   Flush the catheter once per day with 5 mL of 0.9% normal saline unless you are told otherwise by your healthcare provider. This helps to prevent clogs in the catheter.   To disconnect the drain, turn the clear plastic tube to the left. Attach the saline syringe by placing it on the white end of the drain and turning gently to the right. Once attached gently push the plunger to the 5 mL mark. After you are done flushing, disconnect the syringe by turning to the left and reattach your drainage container   If you have a bulb please be sure the bulb is charged after reconnecting it - to do this pinch the bulb between your thumb and first finger and close the stopper located on the top of the bulb.    Check for fluid leaking from around your catheter (instead of fluid draining through your catheter). This may be a sign that the drain is no longer working correctly.   Write down the following information every time you empty your  bag:   The date and time.   The amount of drainage.   Activity   Rest at home for 1-2 days after your procedure.   For the first 48 hours do not lift anything more than 10 lbs (about a gallon of milk). You may perform moderate activities/exercise. Please avoid strenuous activities during this time.   Avoid any activities which may pull on your drain as this can cause your drain to become dislodged.   If you were given a sedative during the procedure, it can affect you for several hours. Do not drive or operate machinery until your health care provider says that it is safe.   General instructions   For mild pain take over-the-counter medications as needed for pain such as  Tylenol or Advil. If you are experiencing severe pain please call our office as this may indicate an issue with your drain.    If you were prescribed an antibiotic medicine, take it as told by your health care provider. Do not stop using the antibiotic even if you start to feel better.   You may shower 24 hours after the drain is placed. To do this cover the insertion site with a water tight material such as saran wrap and seal the edges with tape, you may also purchase waterproof dressings at your local drug store. Shower as usual and then remove the water tight dressing and any gauze/tape underneath it once you have exited the shower and dried off. Allow the area to air dry or pat dry with a clean towel. Once the skin is completely dry place a new gauze dressing. It is important to keep the site dry at all times to prevent infection.   Do not submerge the drain - this means you cannot take baths, swim, use a hot tub, etc. until the drain is removed.    Do not use any products that contain nicotine or tobacco, such as cigarettes, e-cigarettes, and chewing tobacco. If you need help quitting, ask your health care provider.   Keep all follow-up visits as told by your health care provider. This is important.   Contact a health care provider if:   You have less than 10 mL of drainage a day for 2-3 days in a row, or as directed by your health care provider.   You have any of these signs of infection:   More redness, swelling, or pain around your incision area.   More fluid or blood coming from your incision area.   Warmth coming from your incision area.   Pus or a bad smell coming from your incision area.   You have fluid leaking from around your catheter (instead of through your catheter).   You are unable to flush the drain.   You have a fever or chills.   You have pain that does not get better with medicine.   You have not been contacted to schedule a drain follow up appointment  within 10 days of discharge from the hospital.   Please call [facility] at [phone] with any questions or concerns.   Get help right away if:   Your catheter comes out.   You suddenly stop having drainage from your catheter.   You suddenly have blood in the fluid that is draining from your catheter.   You become dizzy or you faint.   You develop a rash.   You have nausea or vomiting.   You have difficulty breathing or you feel short  of breath.   You develop chest pain.   You have problems with your speech or vision.   You have trouble balancing or moving your arms or legs.   Summary   It is common to have a small amount of bruising and discomfort in the area where the drainage tube (catheter) was placed. You may also have minor discomfort with movement while the drain is in place.   Flush the drain once per day with 5 mL of 0.9% normal saline (unless you were told otherwise by your healthcare provider).    Record the amount of drainage from the bag every time you empty it.   Change the dressing every 3 days or earlier if soiled/wet. Keep the skin dry under the dressing.   You may shower with the drain in place. Do not submerge the drain (no baths, swimming, hot tubs, etc.).   Contact [facility] at [phone] if you have more redness, swelling, or pain around your incision area or if you have pain that does not get better with medicine.   This information is not intended to replace advice given to you by your health care provider. Make sure you discuss any questions you have with your health care provider.   Document Revised: 03/29/2021 Document Reviewed: 12/19/2018   Elsevier Patient Education  2023 [employer]         Interventional Radiology Drain Record   Empty your drain at least once per day. You may empty it as often as needed. Use this form to write down the amount of fluid that has collected in the drainage container. Bring  this form with you to your follow-up visits. Please call [facility] at [phone] with any questions or concerns prior to your appointment.   Drain #1 location: ___________________   Date __________ Time __________ Amount __________   Date __________ Time __________ Amount __________   Date __________ Time __________ Amount __________   Date __________ Time __________ Amount __________   Date __________ Time __________ Amount __________   Date __________ Time __________ Amount __________   Date __________ Time __________ Amount __________   Date __________ Time __________ Amount __________   Date __________ Time __________ Amount __________   Date __________ Time __________ Amount __________   Date __________ Time __________ Amount __________   Date __________ Time __________ Amount __________   Date __________ Time __________ Amount __________   Date __________ Time __________ Amount __________

## 2024-01-09 NOTE — H&P (Signed)
 "     Patient Status: [facility] - Out-pt  Assessment and Plan: Abdominal wall fluid collection  Patient hiatal hernia repair 12/15/23 now with large, palpable collection.  CT Abdomen Pelvis shows abdominal wall fluid collection.  IR consulted by Dr. Polly for drain placement.  This has been approved by Dr. Philip.   Drain placement and management discussed with patient and her husband.  They are agreeable to proceed.  She is not NPO today.  Will plan to proceed with local anesthetic only.   Risks and benefits discussed with the patient including bleeding, infection, damage to adjacent structures, bowel perforation/fistula connection, and sepsis.  All of the patient's questions were answered, patient is agreeable to proceed. Consent signed and in chart. ______________________________________________________________________   History of Present Illness: Emily Sharp is a 44 y.o. female who underwent hernia repair 12/15/23.  She now presents with abdominal wall collection concerning for seroma.  IR consulted for aspiration and drainage.   Allergies and medications reviewed.   Review of Systems: A 12 point ROS discussed and pertinent positives are indicated in the HPI above.  All other systems are negative.  Review of Systems  Constitutional:  Negative for fatigue and fever.  Respiratory:  Negative for cough and shortness of breath.   Gastrointestinal:  Positive for abdominal pain.  Psychiatric/Behavioral:  Negative for behavioral problems and confusion.     Vital Signs: BP 124/83   Pulse 90   Temp 99 F (37.2 C) (Oral)   Resp 17   Ht 5' 8 (1.727 m)   Wt 265 lb (120.2 kg)   SpO2 97%   BMI 40.29 kg/m   Physical Exam Vitals and nursing note reviewed.  Constitutional:      Appearance: Normal appearance.  HENT:     Mouth/Throat:     Mouth: Mucous membranes are moist.     Pharynx: Oropharynx is clear.  Cardiovascular:     Rate and Rhythm: Normal rate and regular rhythm.   Pulmonary:     Effort: Pulmonary effort is normal. No respiratory distress.  Abdominal:     General: There is no distension.     Tenderness: There is abdominal tenderness (mild).     Comments: Palpable hardened area across midline.  No active drainage.  Incision intact.   Skin:    General: Skin is warm and dry.  Neurological:     General: No focal deficit present.     Mental Status: She is alert and oriented to person, place, and time. Mental status is at baseline.  Psychiatric:        Mood and Affect: Mood normal.        Behavior: Behavior normal.        Thought Content: Thought content normal.        Judgment: Judgment normal.      Imaging reviewed.   Labs:  COAGS: No results for input(s): INR, APTT in the last 8760 hours.  BMP: Recent Labs    12/15/23 1502 12/16/23 0514 12/17/23 0448  NA  --  137 139  K  --  3.9 3.3*  CL  --  103 105  CO2  --  20* 23  GLUCOSE  --  120* 133*  BUN  --  9 8  CALCIUM  --  9.3 8.5*  CREATININE 0.71 0.62 0.66  GFRNONAA >60 >60 >60       Electronically Signed: Solmon Selmer Ku, PA 01/09/2024, 3:39 PM   I spent a total of 15 minutes  in face to face in clinical consultation, greater than 50% of which was counseling/coordinating care for abdominal wall fluid collection.    "

## 2024-01-12 ENCOUNTER — Other Ambulatory Visit: Payer: Self-pay | Admitting: General Surgery

## 2024-01-12 DIAGNOSIS — R188 Other ascites: Secondary | ICD-10-CM

## 2024-01-12 LAB — AEROBIC/ANAEROBIC CULTURE W GRAM STAIN (SURGICAL/DEEP WOUND): Culture: NO GROWTH

## 2024-01-18 NOTE — Progress Notes (Signed)
 "  Referring Physician(s): Dr. Polly  Chief Complaint: The patient is seen in follow up today s/p abdominal wall fluid collection drain placed 01/09/24.   History of present illness:  Emily Sharp, 44 year old female, has a medical history significant for ADHD, anxiety/depression, PCOS, bradycardia and gestational diabetes. She also has a history of hiatal hernia and underwent repair 12/15/23. She developed a post-op abdominal wall fluid collection and was referred to Interventional Radiology for drain placement. She presented as an outpatient to the [facility] IR department 01/09/24 and received a 12 Fr drain which was placed by Dr. Philip. 250 ml of brown fluid was removed and cultures showed rare gram positive cocci.   She presents to the IR outpatient clinic today for a drain evaluation with CT imaging and possible contrast drain injection under fluoroscopy.   She continues to flush the drain and is getting thin, opaque tan output into the bulb. No new fevers or chills.  Past Medical History:  Diagnosis Date   Abnormal glucose tolerance test (GTT) during pregnancy, antepartum 04/25/2017   ADHD (attention deficit hyperactivity disorder)    Anxiety and depression    Asthma    Bradycardia 05/09/2016   Dichorionic diamniotic twin pregnancy, antepartum 05/06/2017   Encounter for assisted reproductive fertility procedure cycle    Encounter for fertility testing    Female infertility associated with anovulation    Gestational diabetes    Gestational diabetes 06/10/2017   H/O cesarean section 06/10/2017   Multiple gestation 06/10/2017   PCOS (polycystic ovarian syndrome)    PIH (pregnancy induced hypertension) 06/10/2017   Pneumonia    Polyp of corpus uteri     Past Surgical History:  Procedure Laterality Date   BREAST BIOPSY Right 09/24/2022   US  RT BREAST BX W LOC DEV 1ST LESION IMG BX SPEC US  GUIDE 09/24/2022 [facility]AMMOGRAP[facility]   BREAST REDUCTION SURGERY     CESAREAN SECTION  MULTI-GESTATIONAL N/A 06/10/2017   Procedure: PRIMARY CESAREAN SECTION MULTI-GESTATIONAL;  Surgeon: Curlene Agent, MD;  Location: [facility];  Service: Obstetrics;  Laterality: N/A;  [name], RNFA   DILATION AND CURETTAGE OF UTERUS     HERNIA REPAIR     INCISIONAL HERNIA REPAIR     IR US  GUIDE BX ASP/DRAIN  01/09/2024   LAPAROSCOPY  12/15/2023   Procedure: LAPAROSCOPY, DIAGNOSTIC,  Robotic Muscular release of the left rectus muscle to facilitate retromuscular hernia repair, Robotic Muscular release of the right rectus muscle to facilitate retromuscular hernia repair. Open Rives-Stoppa hernia repair with mesh 23 x 30 cm Bard Soft mesh, Partial explantation of previously placed hernia mesh;  Surgeon: Polly Cordella LABOR, MD;  Location: [facility]   OVARIAN CYST REMOVAL     WISDOM TOOTH EXTRACTION     XI ROBOTIC ASSISTED VENTRAL HERNIA N/A 12/15/2023   Procedure: REPAIR, HERNIA, VENTRAL, ROBOT-ASSISTED,;  Surgeon: Polly Cordella LABOR, MD;  Location: [facility];  Service: General;  Laterality: N/A;  ROBOTIC INCISIONAL HERNIA REPAIR BILATERAL TRANSVERSE ABDOMINIS RELEASE MESH PLACEMENT    Allergies: Sulfa antibiotics and Ancef  [cefazolin ]  Medications: Prior to Admission medications  Medication Sig Start Date End Date Taking? Authorizing Provider  Albuterol -Budesonide  (AIRSUPRA ) 90-80 MCG/ACT AERO Inhale 2 puffs into the lungs every 4 (four) hours as needed (coughing, wheezing, chest tightness). Do not exceed 12 puffs in 24 hours. 06/03/23   Luke Orlan HERO, DO  atomoxetine (STRATTERA) 80 MG capsule Take 80 mg by mouth every morning. 04/28/23   [provider]  cetirizine (ZYRTEC) 10 MG tablet  Take 10 mg by mouth daily. 05/22/16   [provider]  doxycycline  (VIBRA -TABS) 100 MG tablet Take 1 tablet (100 mg total) by mouth 2 (two) times daily for 14 days. 01/09/24 01/23/24  Polly Cordella LABOR, MD  EPINEPHrine  0.3 mg/0.3 mL IJ SOAJ injection Inject 0.3 mg into the muscle as needed for  anaphylaxis. 08/17/21   Lorin Norris, MD  FLUoxetine  (PROZAC ) 40 MG capsule TAKE 1 CAPSULE (40 MG TOTAL) BY MOUTH DAILY. START AFTER FINISHING THE 20MG  PRESCRIPTION. 10/22/22   Early, Sara E, NP  fluticasone  (FLONASE ) 50 MCG/ACT nasal spray Place 1 spray into both nostrils daily. Begin by using 2 sprays in each nare daily for 3 to 5 days, then decrease to 1 spray in each nare daily. Patient not taking: Reported on 11/25/2023 05/25/21   Joesph Shaver Scales, PA-C  Multiple Vitamin (QUINTABS) TABS Take 1 tablet by mouth daily. 05/22/16   [provider]  oxyCODONE  (ROXICODONE ) 5 MG immediate release tablet Take 1 tablet (5 mg total) by mouth every 8 (eight) hours as needed. 12/17/23 12/16/24  Polly Cordella LABOR, MD  sodium chloride  flush (NS) 0.9 % SOLN Please flush drain with 5-10 mL saline daily. Please change dressings every 48-72 hours, or any time dressing is wet or soiled. 01/09/24 03/09/24  CarimCarlin LABOR, PA-C     Family History  Problem Relation Age of Onset   Skin cancer Mother    Diabetes Father    Peripheral vascular disease Father    Bladder Cancer Father    Hypertension Father    AAA (abdominal aortic aneurysm) Father    Skin cancer Father    Breast cancer Paternal Aunt    Breast cancer Maternal Grandfather    Skin cancer Maternal Grandfather    Breast cancer Paternal Grandmother     Social History   Socioeconomic History   Marital status: Married    Spouse name: Jeb   Number of children: 2   Years of education: Not on file   Highest education level: Not on file  Occupational History   Occupation: Runner, Broadcasting/film/video  Tobacco Use   Smoking status: Never   Smokeless tobacco: Never  Vaping Use   Vaping status: Never Used  Substance and Sexual Activity   Alcohol use: Yes    Comment: social   Drug use: Never   Sexual activity: Yes    Birth control/protection: None, Other-see comments    Comment: Husband had vascotomy  Other Topics Concern   Not on file  Social  History Narrative   ** Merged History Encounter **       Lives with husband.   Social Drivers of Health   Tobacco Use: Low Risk  (01/02/2024)   Received from [facility]   Patient History    Smoking Tobacco Use: Never    Smokeless Tobacco Use: Never    Passive Exposure: Not on file  Financial Resource Strain: Not on file  Food Insecurity: No Food Insecurity (12/15/2023)   Epic    Worried About Running Out of Food in the Last Year: Never true    Ran Out of Food in the Last Year: Never true  Transportation Needs: No Transportation Needs (12/15/2023)   Epic    Lack of Transportation (Medical): No    Lack of Transportation (Non-Medical): No  Physical Activity: Not on file  Stress: Not on file  Social Connections: Not on file  Depression (PHQ2-9): Low Risk (11/19/2021)   Depression (PHQ2-9)    PHQ-2  Score: 0  Alcohol Screen: Not on file  Housing: Low Risk (12/15/2023)   Epic    Unable to Pay for Housing in the Last Year: No    Number of Times Moved in the Last Year: 0    Homeless in the Last Year: No  Utilities: Not At Risk (12/15/2023)   Epic    Threatened with loss of utilities: No  Health Literacy: Not on file     Vital Signs: There were no vitals taken for this visit.  Physical Exam Constitutional:      General: She is not in acute distress. HENT:     Head: Normocephalic.     Mouth/Throat:     Mouth: Mucous membranes are moist.  Eyes:     General: No scleral icterus. Cardiovascular:     Rate and Rhythm: Normal rate and regular rhythm.  Pulmonary:     Effort: No respiratory distress.  Abdominal:     General: There is no distension.     Comments: Healing midline incision.  RLQ drain in place with light tan, opaque, thin fluid in bulb.    Musculoskeletal:     Right lower leg: No edema.     Left lower leg: No edema.  Skin:    General: Skin is warm and dry.  Neurological:     Mental Status: She is alert and oriented to person, place, and time.      Imaging:   CT Abdomen/Pelvis 01/07/24   US -guided drain placement 01/09/24   CT AP 01/21/24    Labs:  CBC: Recent Labs    12/15/23 1502 12/15/23 2059 12/16/23 0514 12/17/23 0448  WBC 15.4* 12.7* 10.6* 8.7  HGB 13.1 13.4 12.1 11.1*  HCT 39.7 40.7 35.6* 34.1*  PLT 210 270 249 199    COAGS: No results for input(s): INR, APTT in the last 8760 hours.  BMP: Recent Labs    12/15/23 1502 12/16/23 0514 12/17/23 0448  NA  --  137 139  K  --  3.9 3.3*  CL  --  103 105  CO2  --  20* 23  GLUCOSE  --  120* 133*  BUN  --  9 8  CALCIUM  --  9.3 8.5*  CREATININE 0.71 0.62 0.66  GFRNONAA >60 >60 >60    LIVER FUNCTION TESTS: No results for input(s): BILITOT, AST, ALT, ALKPHOS, PROT, ALBUMIN  in the last 8760 hours.  Assessment and Plan: 44 year old female with a history of abdominal hernia status post repair on 12/15/23. Her post-procedure recovery was complicated by the development of a large abdominal wall fluid collection. She was seen in IR 01/09/24 for aspiration with drain placement. The collection has decreased in size but with persistent gas-containing fluid and output via her drain.  Plan for 2 week follow up with CT abdomen/pelvis and possible drain injection.  Recommended increasing flushing up to three times per day.   Ester Sides, MD Pager: [phone]    I spent a total of 25 Minutes in face to face in clinical consultation, greater than 50% of which was counseling/coordinating care for abdominal wall fluid collection.      "

## 2024-01-19 ENCOUNTER — Encounter: Payer: Self-pay | Admitting: General Surgery

## 2024-01-21 ENCOUNTER — Ambulatory Visit
Admission: RE | Admit: 2024-01-21 | Discharge: 2024-01-21 | Disposition: A | Discharge status: Home / Self Care | Source: Ambulatory Visit | Attending: General Surgery | Admitting: General Surgery

## 2024-01-21 ENCOUNTER — Inpatient Hospital Stay
Admission: RE | Admit: 2024-01-21 | Discharge: 2024-01-21 | Disposition: A | Source: Ambulatory Visit | Attending: General Surgery

## 2024-01-21 ENCOUNTER — Other Ambulatory Visit: Payer: Self-pay | Admitting: General Surgery

## 2024-01-21 ENCOUNTER — Inpatient Hospital Stay
Admission: RE | Admit: 2024-01-21 | Discharge: 2024-01-21 | Disposition: A | Source: Ambulatory Visit | Attending: Urology

## 2024-01-21 DIAGNOSIS — R188 Other ascites: Secondary | ICD-10-CM

## 2024-01-21 HISTORY — PX: IR RADIOLOGIST EVAL & MGMT: IMG5224

## 2024-01-21 MED ORDER — IOPAMIDOL (ISOVUE-300) INJECTION 61%
100.0000 mL | Freq: Once | INTRAVENOUS | Status: AC | PRN
  Administered 2024-01-21: 100 mL via INTRAVENOUS

## 2024-01-23 ENCOUNTER — Inpatient Hospital Stay: Admission: RE | Admit: 2024-01-23 | Source: Ambulatory Visit

## 2024-01-23 ENCOUNTER — Other Ambulatory Visit

## 2024-02-03 ENCOUNTER — Telehealth: Admitting: Physician Assistant

## 2024-02-03 DIAGNOSIS — J208 Acute bronchitis due to other specified organisms: Secondary | ICD-10-CM

## 2024-02-03 DIAGNOSIS — J45901 Unspecified asthma with (acute) exacerbation: Secondary | ICD-10-CM

## 2024-02-03 MED ORDER — PREDNISONE 10 MG (21) PO TBPK: ORAL_TABLET | ORAL | 0 refills | Status: DC

## 2024-02-03 MED ORDER — BENZONATATE 100 MG PO CAPS: 100.0000 mg | ORAL_CAPSULE | Freq: Three times a day (TID) | ORAL | 0 refills | Status: DC | PRN

## 2024-02-03 NOTE — Progress Notes (Signed)
 We are sorry that you are not feeling well.  Here is how we plan to help!  Based on your presentation I believe you most likely have A cough due to a virus.  This is called viral bronchitis and is best treated by rest, plenty of fluids and control of the cough.  You may use Ibuprofen  or Tylenol  as directed to help your symptoms.     In addition you may use A prescription cough medication called Tessalon  Perles 100mg . You may take 1-2 capsules every 8 hours as needed for your cough.  I have also sent in a prednisone  pack for you to take as directed.  From your responses in the eVisit questionnaire you describe inflammation in the upper respiratory tract which is causing a significant cough.  This is commonly called Bronchitis and has four common causes:   Allergies Viral Infections Acid Reflux Bacterial Infection Allergies, viruses and acid reflux are treated by controlling symptoms or eliminating the cause. An example might be a cough caused by taking certain blood pressure medications. You stop the cough by changing the medication. Another example might be a cough caused by acid reflux. Controlling the reflux helps control the cough.  USE OF BRONCHODILATOR (RESCUE) INHALERS: There is a risk from using your bronchodilator too frequently.  The risk is that over-reliance on a medication which only relaxes the muscles surrounding the breathing tubes can reduce the effectiveness of medications prescribed to reduce swelling and congestion of the tubes themselves.  Although you feel brief relief from the bronchodilator inhaler, your asthma may actually be worsening with the tubes becoming more swollen and filled with mucus.  This can delay other crucial treatments, such as oral steroid medications. If you need to use a bronchodilator inhaler daily, several times per day, you should discuss this with your provider.  There are probably better treatments that could be used to keep your asthma under control.      HOME CARE Only take medications as instructed by your medical team. Complete the entire course of an antibiotic. Drink plenty of fluids and get plenty of rest. Avoid close contacts especially the very young and the elderly Cover your mouth if you cough or cough into your sleeve. Always remember to wash your hands A steam or ultrasonic humidifier can help congestion.   GET HELP RIGHT AWAY IF: You develop worsening fever. You become short of breath You cough up blood. Your symptoms persist after you have completed your treatment plan MAKE SURE YOU  Understand these instructions. Will watch your condition. Will get help right away if you are not doing well or get worse.  Your e-visit answers were reviewed by a board certified advanced clinical practitioner to complete your personal care plan.  Depending on the condition, your plan could have included both over the counter or prescription medications. If there is a problem please reply  once you have received a response from your provider. Your safety is important to us .  If you have drug allergies check your prescription carefully.    You can use MyChart to ask questions about todays visit, request a non-urgent call back, or ask for a work or school excuse for 24 hours related to this e-Visit. If it has been greater than 24 hours you will need to follow up with your provider, or enter a new e-Visit to address those concerns. You will get an e-mail in the next two days asking about your experience.  I hope that your e-visit  has been valuable and will speed your recovery. Thank you for using e-visits.   I have spent 5 minutes in review of e-visit questionnaire, review and updating patient chart, medical decision making and response to patient.   Elsie Velma Lunger, PA-C

## 2024-02-05 ENCOUNTER — Encounter: Payer: Self-pay | Admitting: Radiology

## 2024-02-06 ENCOUNTER — Other Ambulatory Visit

## 2024-02-06 ENCOUNTER — Ambulatory Visit
Admission: RE | Admit: 2024-02-06 | Discharge: 2024-02-06 | Disposition: A | Source: Ambulatory Visit | Attending: General Surgery

## 2024-02-06 DIAGNOSIS — R188 Other ascites: Secondary | ICD-10-CM

## 2024-02-06 MED ORDER — IOPAMIDOL (ISOVUE-300) INJECTION 61%
100.0000 mL | Freq: Once | INTRAVENOUS | Status: AC | PRN
  Administered 2024-02-06: 100 mL via INTRAVENOUS

## 2024-02-06 NOTE — Progress Notes (Signed)
 "  Referring Physician(s): Metzger,Gregory A  Chief Complaint: The patient is seen in follow up today s/p operative hernia repair 12/15/23- post op abscess.  IR drain placed 01/09/24   History of present illness:  Abdominal wall fluid collection post hernia repair 12/15/23 with Dr KANDICE Polly Ribas still in place Previous CT 01/21/24: The collection has decreased in size but with persistent gas-containing fluid and output via her drain.   Finished antibiotic course days ago Denies pain; fever; chills Denies N/V/D Flushes 5 cc BID OP 20-30 cc daily; clear fluid  Does not have an appt with Dr Polly scheduled yet Scheduled here today for CT scan and drain evaluation    Past Medical History:  Diagnosis Date   Abnormal glucose tolerance test (GTT) during pregnancy, antepartum 04/25/2017   ADHD (attention deficit hyperactivity disorder)    Anxiety and depression    Asthma    Bradycardia 05/09/2016   Dichorionic diamniotic twin pregnancy, antepartum 05/06/2017   Encounter for assisted reproductive fertility procedure cycle    Encounter for fertility testing    Female infertility associated with anovulation    Gestational diabetes    Gestational diabetes 06/10/2017   H/O cesarean section 06/10/2017   Multiple gestation 06/10/2017   PCOS (polycystic ovarian syndrome)    PIH (pregnancy induced hypertension) 06/10/2017   Pneumonia    Polyp of corpus uteri     Past Surgical History:  Procedure Laterality Date   BREAST BIOPSY Right 09/24/2022   US  RT BREAST BX W LOC DEV 1ST LESION IMG BX SPEC US  GUIDE 09/24/2022 [facility]MOGRAP[facility]   BREAST REDUCTION SURGERY     CESAREAN SECTION MULTI-GESTATIONAL N/A 06/10/2017   Procedure: PRIMARY CESAREAN SECTION MULTI-GESTATIONAL;  Surgeon: Curlene Agent, MD;  Location: [facility];  Service: Obstetrics;  Laterality: N/A;  [name], RNFA   DILATION AND CURETTAGE OF UTERUS     HERNIA REPAIR     INCISIONAL HERNIA REPAIR     IR RADIOLOGIST  EVAL & MGMT  01/21/2024   IR US  GUIDE BX ASP/DRAIN  01/09/2024   LAPAROSCOPY  12/15/2023   Procedure: LAPAROSCOPY, DIAGNOSTIC,  Robotic Muscular release of the left rectus muscle to facilitate retromuscular hernia repair, Robotic Muscular release of the right rectus muscle to facilitate retromuscular hernia repair. Open Rives-Stoppa hernia repair with mesh 23 x 30 cm Bard Soft mesh, Partial explantation of previously placed hernia mesh;  Surgeon: Polly Cordella LABOR, MD;  Location: [facility]   OVARIAN CYST REMOVAL     WISDOM TOOTH EXTRACTION     XI ROBOTIC ASSISTED VENTRAL HERNIA N/A 12/15/2023   Procedure: REPAIR, HERNIA, VENTRAL, ROBOT-ASSISTED,;  Surgeon: Polly Cordella LABOR, MD;  Location: [facility];  Service: General;  Laterality: N/A;  ROBOTIC INCISIONAL HERNIA REPAIR BILATERAL TRANSVERSE ABDOMINIS RELEASE MESH PLACEMENT    Allergies: Sulfa antibiotics and Ancef  [cefazolin ]  Medications: Prior to Admission medications  Medication Sig Start Date End Date Taking? Authorizing Provider  Albuterol -Budesonide  (AIRSUPRA ) 90-80 MCG/ACT AERO Inhale 2 puffs into the lungs every 4 (four) hours as needed (coughing, wheezing, chest tightness). Do not exceed 12 puffs in 24 hours. 06/03/23   Luke Orlan HERO, DO  atomoxetine (STRATTERA) 80 MG capsule Take 80 mg by mouth every morning. 04/28/23   [provider]  benzonatate  (TESSALON ) 100 MG capsule Take 1 capsule (100 mg total) by mouth 3 (three) times daily as needed for cough. 02/03/24   Gladis Elsie BROCKS, PA-C  cetirizine (ZYRTEC) 10 MG tablet Take 10 mg by mouth daily. 05/22/16  [provider]  EPINEPHrine  0.3 mg/0.3 mL IJ SOAJ injection Inject 0.3 mg into the muscle as needed for anaphylaxis. 08/17/21   Lorin Norris, MD  FLUoxetine  (PROZAC ) 40 MG capsule TAKE 1 CAPSULE (40 MG TOTAL) BY MOUTH DAILY. START AFTER FINISHING THE 20MG  PRESCRIPTION. 10/22/22   Early, Sara E, NP  fluticasone  (FLONASE ) 50 MCG/ACT nasal spray Place 1 spray into both  nostrils daily. Begin by using 2 sprays in each nare daily for 3 to 5 days, then decrease to 1 spray in each nare daily. Patient not taking: Reported on 11/25/2023 05/25/21   Joesph Shaver Scales, PA-C  Multiple Vitamin (QUINTABS) TABS Take 1 tablet by mouth daily. 05/22/16   [provider]  oxyCODONE  (ROXICODONE ) 5 MG immediate release tablet Take 1 tablet (5 mg total) by mouth every 8 (eight) hours as needed. 12/17/23 12/16/24  Polly Cordella LABOR, MD  predniSONE  (STERAPRED UNI-PAK 21 TAB) 10 MG (21) TBPK tablet Take following package directions 02/03/24   Gladis Elsie BROCKS, PA-C  sodium chloride  flush (NS) 0.9 % SOLN Please flush drain with 5-10 mL saline daily. Please change dressings every 48-72 hours, or any time dressing is wet or soiled. 01/09/24 03/09/24  CarimCarlin LABOR, PA-C     Family History  Problem Relation Age of Onset   Skin cancer Mother    Diabetes Father    Peripheral vascular disease Father    Bladder Cancer Father    Hypertension Father    AAA (abdominal aortic aneurysm) Father    Skin cancer Father    Breast cancer Paternal Aunt    Breast cancer Maternal Grandfather    Skin cancer Maternal Grandfather    Breast cancer Paternal Grandmother     Social History   Socioeconomic History   Marital status: Married    Spouse name: Jeb   Number of children: 2   Years of education: Not on file   Highest education level: Not on file  Occupational History   Occupation: Runner, Broadcasting/film/video  Tobacco Use   Smoking status: Never   Smokeless tobacco: Never  Vaping Use   Vaping status: Never Used  Substance and Sexual Activity   Alcohol use: Yes    Comment: social   Drug use: Never   Sexual activity: Yes    Birth control/protection: None, Other-see comments    Comment: Husband had vascotomy  Other Topics Concern   Not on file  Social History Narrative   ** Merged History Encounter **       Lives with husband.   Social Drivers of Health   Tobacco Use: Low Risk   (01/02/2024)   Received from [facility]   Patient History    Smoking Tobacco Use: Never    Smokeless Tobacco Use: Never    Passive Exposure: Not on file  Financial Resource Strain: Not on file  Food Insecurity: No Food Insecurity (12/15/2023)   Epic    Worried About Running Out of Food in the Last Year: Never true    Ran Out of Food in the Last Year: Never true  Transportation Needs: No Transportation Needs (12/15/2023)   Epic    Lack of Transportation (Medical): No    Lack of Transportation (Non-Medical): No  Physical Activity: Not on file  Stress: Not on file  Social Connections: Not on file  Depression (PHQ2-9): Low Risk (11/19/2021)   Depression (PHQ2-9)    PHQ-2 Score: 0  Alcohol Screen: Not on file  Housing: Low Risk (12/15/2023)  Epic    Unable to Pay for Housing in the Last Year: No    Number of Times Moved in the Last Year: 0    Homeless in the Last Year: No  Utilities: Not At Risk (12/15/2023)   Epic    Threatened with loss of utilities: No  Health Literacy: Not on file     Vital Signs: LMP 12/28/2023 (Exact Date)   Physical Exam Skin:    General: Skin is warm and dry.     Findings: No erythema.     Comments: Skin is clean and dry NT; no redness No bleeding; no sign of infection  Drain intact  Removal per Dr Johann order Dressing placed      Imaging: No results found.  Labs:  CBC: Recent Labs    12/15/23 1502 12/15/23 2059 12/16/23 0514 12/17/23 0448  WBC 15.4* 12.7* 10.6* 8.7  HGB 13.1 13.4 12.1 11.1*  HCT 39.7 40.7 35.6* 34.1*  PLT 210 270 249 199    COAGS: No results for input(s): INR, APTT in the last 8760 hours.  BMP: Recent Labs    12/15/23 1502 12/16/23 0514 12/17/23 0448  NA  --  137 139  K  --  3.9 3.3*  CL  --  103 105  CO2  --  20* 23  GLUCOSE  --  120* 133*  BUN  --  9 8  CALCIUM  --  9.3 8.5*  CREATININE 0.71 0.62 0.66  GFRNONAA >60 >60 >60    LIVER FUNCTION TESTS: No results for  input(s): BILITOT, AST, ALT, ALKPHOS, PROT, ALBUMIN  in the last 8760 hours.  Assessment:  Post hernia repair (12/15/23) abdominal wall abscess Drain placed in IR 01/09/24 Returns today after 1/14 CT showing persistent gas-containing fluid and output via her drain.   CT scan today: shows resolution of abscess per Dr Johann Removal of drain per his order  Encourage to follow up with Dr Polly She has good understanding of plan  Signed: Sharlet DELENA Candle, PA-C 02/06/2024, 11:21 AM   Please refer to Dr. Johann attestation of this note for management and plan.       "

## 2024-02-12 ENCOUNTER — Telehealth: Admitting: Physician Assistant

## 2024-02-12 DIAGNOSIS — B9689 Other specified bacterial agents as the cause of diseases classified elsewhere: Secondary | ICD-10-CM

## 2024-02-12 MED ORDER — DOXYCYCLINE HYCLATE 100 MG PO TABS: 100.0000 mg | ORAL_TABLET | Freq: Two times a day (BID) | ORAL | 0 refills | Status: AC

## 2024-02-12 MED ORDER — PROMETHAZINE-DM 6.25-15 MG/5ML PO SYRP: 5.0000 mL | ORAL_SOLUTION | Freq: Four times a day (QID) | ORAL | 0 refills | Status: AC | PRN

## 2024-02-12 NOTE — Progress Notes (Signed)
 We are sorry that you are not feeling well.  Here is how we plan to help!  Based on your presentation I believe you most likely have A cough due to bacteria.  When patients have a fever and a productive cough with a change in color or increased sputum production, we are concerned about bacterial bronchitis.  If left untreated it can progress to pneumonia.  If your symptoms do not improve with your treatment plan it is important that you contact your provider.   I have prescribed Doxycycline  100 mg twice a day for 7 days     In addition I have sent in a prescription cough syrup -- promethazine -DM -- to take as directed. For headache, ok to continue OTC Tylenol  or Ibuprofen . If having any substantial nasal congestion with this, please consider adding on a nasal steroid spray like Flonase  or Nasacort.   From your responses in the eVisit questionnaire you describe inflammation in the upper respiratory tract which is causing a significant cough.  This is commonly called Bronchitis and has four common causes:   Allergies Viral Infections Acid Reflux Bacterial Infection Allergies, viruses and acid reflux are treated by controlling symptoms or eliminating the cause. An example might be a cough caused by taking certain blood pressure medications. You stop the cough by changing the medication. Another example might be a cough caused by acid reflux. Controlling the reflux helps control the cough.  USE OF BRONCHODILATOR (RESCUE) INHALERS: There is a risk from using your bronchodilator too frequently.  The risk is that over-reliance on a medication which only relaxes the muscles surrounding the breathing tubes can reduce the effectiveness of medications prescribed to reduce swelling and congestion of the tubes themselves.  Although you feel brief relief from the bronchodilator inhaler, your asthma may actually be worsening with the tubes becoming more swollen and filled with mucus.  This can delay other crucial  treatments, such as oral steroid medications. If you need to use a bronchodilator inhaler daily, several times per day, you should discuss this with your provider.  There are probably better treatments that could be used to keep your asthma under control.     HOME CARE Only take medications as instructed by your medical team. Complete the entire course of an antibiotic. Drink plenty of fluids and get plenty of rest. Avoid close contacts especially the very young and the elderly Cover your mouth if you cough or cough into your sleeve. Always remember to wash your hands A steam or ultrasonic humidifier can help congestion.   GET HELP RIGHT AWAY IF: You develop worsening fever. You become short of breath You cough up blood. Your symptoms persist after you have completed your treatment plan MAKE SURE YOU  Understand these instructions. Will watch your condition. Will get help right away if you are not doing well or get worse.  Your e-visit answers were reviewed by a board certified advanced clinical practitioner to complete your personal care plan.  Depending on the condition, your plan could have included both over the counter or prescription medications. If there is a problem please reply  once you have received a response from your provider. Your safety is important to us .  If you have drug allergies check your prescription carefully.    You can use MyChart to ask questions about todays visit, request a non-urgent call back, or ask for a work or school excuse for 24 hours related to this e-Visit. If it has been greater than 24  hours you will need to follow up with your provider, or enter a new e-Visit to address those concerns. You will get an e-mail in the next two days asking about your experience.  I hope that your e-visit has been valuable and will speed your recovery. Thank you for using e-visits.   I have spent 5 minutes in review of e-visit questionnaire, review and updating  patient chart, medical decision making and response to patient.   Elsie Velma Lunger, PA-C

## 2024-06-24 ENCOUNTER — Ambulatory Visit: Admitting: Allergy
